# Patient Record
Sex: Male | Born: 1970 | Race: Black or African American | Hispanic: No | Marital: Married | Smoking: Former smoker
Health system: Southern US, Community
[De-identification: ages and names within clinical notes are randomized; demographics above are authoritative.]

## PROBLEM LIST (undated history)

## (undated) DIAGNOSIS — M722 Plantar fascial fibromatosis: Secondary | ICD-10-CM

## (undated) DIAGNOSIS — M549 Dorsalgia, unspecified: Secondary | ICD-10-CM

## (undated) DIAGNOSIS — R000 Tachycardia, unspecified: Secondary | ICD-10-CM

## (undated) DIAGNOSIS — K760 Fatty (change of) liver, not elsewhere classified: Secondary | ICD-10-CM

## (undated) DIAGNOSIS — G4730 Sleep apnea, unspecified: Secondary | ICD-10-CM

## (undated) DIAGNOSIS — M2140 Flat foot [pes planus] (acquired), unspecified foot: Secondary | ICD-10-CM

## (undated) DIAGNOSIS — E669 Obesity, unspecified: Secondary | ICD-10-CM

## (undated) DIAGNOSIS — K5792 Diverticulitis of intestine, part unspecified, without perforation or abscess without bleeding: Secondary | ICD-10-CM

## (undated) DIAGNOSIS — E7800 Pure hypercholesterolemia, unspecified: Secondary | ICD-10-CM

## (undated) DIAGNOSIS — G5600 Carpal tunnel syndrome, unspecified upper limb: Secondary | ICD-10-CM

## (undated) DIAGNOSIS — E785 Hyperlipidemia, unspecified: Secondary | ICD-10-CM

## (undated) DIAGNOSIS — E119 Type 2 diabetes mellitus without complications: Secondary | ICD-10-CM

## (undated) DIAGNOSIS — M6740 Ganglion, unspecified site: Secondary | ICD-10-CM

## (undated) DIAGNOSIS — I10 Essential (primary) hypertension: Secondary | ICD-10-CM

---

## 2005-02-22 ENCOUNTER — Emergency Department (HOSPITAL_COMMUNITY): Admission: EM | Admit: 2005-02-22 | Discharge: 2005-02-22 | Payer: Self-pay | Admitting: Emergency Medicine

## 2005-02-22 ENCOUNTER — Emergency Department: Payer: Self-pay | Admitting: Emergency Medicine

## 2005-02-24 ENCOUNTER — Ambulatory Visit: Payer: Self-pay

## 2008-12-03 ENCOUNTER — Emergency Department: Payer: Self-pay | Admitting: Emergency Medicine

## 2009-10-30 ENCOUNTER — Ambulatory Visit: Payer: Self-pay | Admitting: Internal Medicine

## 2009-10-30 ENCOUNTER — Inpatient Hospital Stay (HOSPITAL_COMMUNITY): Admission: EM | Admit: 2009-10-30 | Discharge: 2009-11-04 | Payer: Self-pay | Admitting: Emergency Medicine

## 2010-07-26 LAB — GLUCOSE, CAPILLARY
Glucose-Capillary: 110 mg/dL — ABNORMAL HIGH (ref 70–99)
Glucose-Capillary: 118 mg/dL — ABNORMAL HIGH (ref 70–99)
Glucose-Capillary: 118 mg/dL — ABNORMAL HIGH (ref 70–99)
Glucose-Capillary: 120 mg/dL — ABNORMAL HIGH (ref 70–99)
Glucose-Capillary: 129 mg/dL — ABNORMAL HIGH (ref 70–99)
Glucose-Capillary: 132 mg/dL — ABNORMAL HIGH (ref 70–99)
Glucose-Capillary: 135 mg/dL — ABNORMAL HIGH (ref 70–99)
Glucose-Capillary: 138 mg/dL — ABNORMAL HIGH (ref 70–99)
Glucose-Capillary: 139 mg/dL — ABNORMAL HIGH (ref 70–99)
Glucose-Capillary: 139 mg/dL — ABNORMAL HIGH (ref 70–99)
Glucose-Capillary: 139 mg/dL — ABNORMAL HIGH (ref 70–99)
Glucose-Capillary: 145 mg/dL — ABNORMAL HIGH (ref 70–99)
Glucose-Capillary: 145 mg/dL — ABNORMAL HIGH (ref 70–99)
Glucose-Capillary: 147 mg/dL — ABNORMAL HIGH (ref 70–99)
Glucose-Capillary: 149 mg/dL — ABNORMAL HIGH (ref 70–99)
Glucose-Capillary: 151 mg/dL — ABNORMAL HIGH (ref 70–99)
Glucose-Capillary: 153 mg/dL — ABNORMAL HIGH (ref 70–99)
Glucose-Capillary: 170 mg/dL — ABNORMAL HIGH (ref 70–99)
Glucose-Capillary: 174 mg/dL — ABNORMAL HIGH (ref 70–99)

## 2010-07-26 LAB — COMPREHENSIVE METABOLIC PANEL
ALT: 48 U/L (ref 0–53)
AST: 22 U/L (ref 0–37)
Albumin: 3.7 g/dL (ref 3.5–5.2)
Alkaline Phosphatase: 61 U/L (ref 39–117)
BUN: 4 mg/dL — ABNORMAL LOW (ref 6–23)
CO2: 28 mEq/L (ref 19–32)
Calcium: 9 mg/dL (ref 8.4–10.5)
Chloride: 100 mEq/L (ref 96–112)
Creatinine, Ser: 0.99 mg/dL (ref 0.4–1.5)
GFR calc Af Amer: >60 mL/min (ref >60)
GFR calc non Af Amer: >60 mL/min (ref >60)
Glucose, Bld: 133 mg/dL — ABNORMAL HIGH (ref 70–99)
Potassium: 3.9 mEq/L (ref 3.5–5.1)
Sodium: 135 mEq/L (ref 135–145)
Total Bilirubin: 1.6 mg/dL — ABNORMAL HIGH (ref 0.3–1.2)
Total Protein: 7 g/dL (ref 6.0–8.3)

## 2010-07-26 LAB — URINE MICROSCOPIC-ADD ON

## 2010-07-26 LAB — URINALYSIS, ROUTINE W REFLEX MICROSCOPIC
Glucose, UA: NEGATIVE mg/dL
Hgb urine dipstick: NEGATIVE
Ketones, ur: 15 mg/dL — AB
Nitrite: NEGATIVE
Protein, ur: 30 mg/dL — AB
Specific Gravity, Urine: 1.027 (ref 1.005–1.030)
Urobilinogen, UA: 1 mg/dL (ref 0.0–1.0)
pH: 6 (ref 5.0–8.0)

## 2010-07-26 LAB — BASIC METABOLIC PANEL
BUN: 4 mg/dL — ABNORMAL LOW (ref 6–23)
CO2: 25 mEq/L (ref 19–32)
Calcium: 8.7 mg/dL (ref 8.4–10.5)
Calcium: 8.8 mg/dL (ref 8.4–10.5)
Chloride: 98 mEq/L (ref 96–112)
Chloride: 99 mEq/L (ref 96–112)
Creatinine, Ser: 0.95 mg/dL (ref 0.4–1.5)
Creatinine, Ser: 0.95 mg/dL (ref 0.4–1.5)
GFR calc Af Amer: >60 mL/min (ref >60)
GFR calc Af Amer: >60 mL/min (ref >60)
GFR calc non Af Amer: >60 mL/min (ref >60)
Glucose, Bld: 124 mg/dL — ABNORMAL HIGH (ref 70–99)
Potassium: 3.2 mEq/L — ABNORMAL LOW (ref 3.5–5.1)
Sodium: 135 mEq/L (ref 135–145)

## 2010-07-26 LAB — CBC
HCT: 42.4 % (ref 39.0–52.0)
HCT: 43.1 % (ref 39.0–52.0)
HCT: 45.1 % (ref 39.0–52.0)
Hemoglobin: 13.1 g/dL (ref 13.0–17.0)
Hemoglobin: 14.4 g/dL (ref 13.0–17.0)
Hemoglobin: 14.7 g/dL (ref 13.0–17.0)
Hemoglobin: 15.6 g/dL (ref 13.0–17.0)
MCH: 30 pg (ref 26.0–34.0)
MCH: 30.2 pg (ref 26.0–34.0)
MCH: 30.7 pg (ref 26.0–34.0)
MCH: 30.7 pg (ref 26.0–34.0)
MCHC: 33.9 g/dL (ref 30.0–36.0)
MCHC: 34.1 g/dL (ref 30.0–36.0)
MCHC: 34.7 g/dL (ref 30.0–36.0)
MCV: 88.1 fL (ref 78.0–100.0)
MCV: 88.2 fL (ref 78.0–100.0)
MCV: 88.5 fL (ref 78.0–100.0)
MCV: 89 fL (ref 78.0–100.0)
Platelets: 224 10*3/uL (ref 150–400)
Platelets: 227 10*3/uL (ref 150–400)
Platelets: 239 10*3/uL (ref 150–400)
Platelets: 261 10*3/uL (ref 150–400)
RBC: 4.27 MIL/uL (ref 4.22–5.81)
RBC: 4.76 MIL/uL (ref 4.22–5.81)
RBC: 4.78 MIL/uL (ref 4.22–5.81)
RBC: 4.89 MIL/uL (ref 4.22–5.81)
RBC: 5.1 MIL/uL (ref 4.22–5.81)
RDW: 13 % (ref 11.5–15.5)
RDW: 13.1 % (ref 11.5–15.5)
RDW: 13.3 % (ref 11.5–15.5)
WBC: 14.1 10*3/uL — ABNORMAL HIGH (ref 4.0–10.5)
WBC: 15.5 10*3/uL — ABNORMAL HIGH (ref 4.0–10.5)
WBC: 15.8 10*3/uL — ABNORMAL HIGH (ref 4.0–10.5)
WBC: 16.6 10*3/uL — ABNORMAL HIGH (ref 4.0–10.5)

## 2010-07-26 LAB — HEMOGLOBIN A1C
Hgb A1c MFr Bld: 10.6 % — ABNORMAL HIGH (ref <5.7)
Mean Plasma Glucose: 258 mg/dL — ABNORMAL HIGH (ref <117)

## 2010-07-26 LAB — POCT I-STAT, CHEM 8
BUN: 5 mg/dL — ABNORMAL LOW (ref 6–23)
Calcium, Ion: 1.14 mmol/L (ref 1.12–1.32)
Chloride: 98 mEq/L (ref 96–112)
Creatinine, Ser: 1 mg/dL (ref 0.4–1.5)
Glucose, Bld: 156 mg/dL — ABNORMAL HIGH (ref 70–99)
HCT: 50 % (ref 39.0–52.0)
Hemoglobin: 17 g/dL (ref 13.0–17.0)
Potassium: 4 mEq/L (ref 3.5–5.1)
Sodium: 136 mEq/L (ref 135–145)
TCO2: 28 mmol/L (ref 0–100)

## 2010-07-26 LAB — DIFFERENTIAL
Basophils Absolute: 0.1 10*3/uL (ref 0.0–0.1)
Basophils Relative: 1 % (ref 0–1)
Eosinophils Absolute: 0 10*3/uL (ref 0.0–0.7)
Eosinophils Relative: 0 % (ref 0–5)
Lymphocytes Relative: 10 % — ABNORMAL LOW (ref 12–46)
Lymphs Abs: 1.4 10*3/uL (ref 0.7–4.0)
Monocytes Absolute: 1 10*3/uL (ref 0.1–1.0)
Monocytes Relative: 8 % (ref 3–12)
Neutro Abs: 11.5 10*3/uL — ABNORMAL HIGH (ref 1.7–7.7)
Neutrophils Relative %: 82 % — ABNORMAL HIGH (ref 43–77)

## 2010-07-26 LAB — LIPASE, BLOOD: Lipase: 20 U/L (ref 11–59)

## 2010-07-26 LAB — MAGNESIUM: Magnesium: 1.9 mg/dL (ref 1.5–2.5)

## 2011-01-05 ENCOUNTER — Emergency Department: Payer: Self-pay | Admitting: Emergency Medicine

## 2011-09-24 ENCOUNTER — Ambulatory Visit: Payer: Self-pay | Admitting: Internal Medicine

## 2011-09-24 LAB — CREATININE, SERUM
Creatinine: 0.82 mg/dL (ref 0.60–1.30)
EGFR (Non-African Amer.): >60

## 2012-07-04 ENCOUNTER — Emergency Department: Payer: Self-pay | Admitting: Emergency Medicine

## 2012-07-04 LAB — COMPREHENSIVE METABOLIC PANEL
Albumin: 3.6 g/dL (ref 3.4–5.0)
Alkaline Phosphatase: 90 U/L (ref 50–136)
Anion Gap: 0 — ABNORMAL LOW (ref 7–16)
Bilirubin,Total: 0.2 mg/dL (ref 0.2–1.0)
Calcium, Total: 8.8 mg/dL (ref 8.5–10.1)
Chloride: 104 mmol/L (ref 98–107)
Co2: 28 mmol/L (ref 21–32)
Creatinine: 0.96 mg/dL (ref 0.60–1.30)
Glucose: 162 mg/dL — ABNORMAL HIGH (ref 65–99)
Potassium: 3.8 mmol/L (ref 3.5–5.1)
SGOT(AST): 26 U/L (ref 15–37)
Sodium: 132 mmol/L — ABNORMAL LOW (ref 136–145)

## 2012-07-04 LAB — CBC
HCT: 42.7 % (ref 40.0–52.0)
MCHC: 33.2 g/dL (ref 32.0–36.0)
Platelet: 294 10*3/uL (ref 150–440)
RBC: 4.81 10*6/uL (ref 4.40–5.90)
RDW: 13.3 % (ref 11.5–14.5)
WBC: 11 10*3/uL — ABNORMAL HIGH (ref 3.8–10.6)

## 2012-07-04 LAB — CK TOTAL AND CKMB (NOT AT ARMC)
CK, Total: 137 U/L (ref 35–232)
CK-MB: 1.1 ng/mL (ref 0.5–3.6)

## 2012-07-04 LAB — TROPONIN I: Troponin-I: <0.02 ng/mL

## 2012-10-02 ENCOUNTER — Emergency Department: Payer: Self-pay | Admitting: Emergency Medicine

## 2012-10-02 LAB — COMPREHENSIVE METABOLIC PANEL
Alkaline Phosphatase: 96 U/L (ref 50–136)
Anion Gap: 7 (ref 7–16)
BUN: 14 mg/dL (ref 7–18)
Chloride: 97 mmol/L — ABNORMAL LOW (ref 98–107)
Creatinine: 1.08 mg/dL (ref 0.60–1.30)
EGFR (African American): >60
EGFR (Non-African Amer.): >60
Glucose: 529 mg/dL (ref 65–99)
Osmolality: 285 (ref 275–301)
SGOT(AST): 26 U/L (ref 15–37)
Total Protein: 7.7 g/dL (ref 6.4–8.2)

## 2012-10-02 LAB — CBC
Platelet: 263 10*3/uL (ref 150–440)
RDW: 12.8 % (ref 11.5–14.5)
WBC: 8.9 10*3/uL (ref 3.8–10.6)

## 2012-10-02 LAB — URINALYSIS, COMPLETE
Bilirubin,UR: NEGATIVE
Blood: NEGATIVE
Ketone: NEGATIVE
Leukocyte Esterase: NEGATIVE
Protein: NEGATIVE
RBC,UR: <1 /HPF (ref 0–5)
Squamous Epithelial: NONE SEEN
WBC UR: <1 /HPF (ref 0–5)

## 2013-02-18 ENCOUNTER — Emergency Department (HOSPITAL_COMMUNITY)
Admission: EM | Admit: 2013-02-18 | Discharge: 2013-02-18 | Disposition: A | Discharge status: Home / Self Care | Payer: 59 | Attending: Emergency Medicine | Admitting: Emergency Medicine

## 2013-02-18 ENCOUNTER — Encounter (HOSPITAL_COMMUNITY): Payer: Self-pay | Admitting: Emergency Medicine

## 2013-02-18 DIAGNOSIS — E119 Type 2 diabetes mellitus without complications: Secondary | ICD-10-CM | POA: Insufficient documentation

## 2013-02-18 DIAGNOSIS — S39012A Strain of muscle, fascia and tendon of lower back, initial encounter: Secondary | ICD-10-CM

## 2013-02-18 DIAGNOSIS — Y929 Unspecified place or not applicable: Secondary | ICD-10-CM | POA: Insufficient documentation

## 2013-02-18 DIAGNOSIS — E7800 Pure hypercholesterolemia, unspecified: Secondary | ICD-10-CM | POA: Insufficient documentation

## 2013-02-18 DIAGNOSIS — X500XXA Overexertion from strenuous movement or load, initial encounter: Secondary | ICD-10-CM | POA: Insufficient documentation

## 2013-02-18 DIAGNOSIS — Y939 Activity, unspecified: Secondary | ICD-10-CM | POA: Insufficient documentation

## 2013-02-18 DIAGNOSIS — F17200 Nicotine dependence, unspecified, uncomplicated: Secondary | ICD-10-CM | POA: Insufficient documentation

## 2013-02-18 DIAGNOSIS — Z79899 Other long term (current) drug therapy: Secondary | ICD-10-CM | POA: Insufficient documentation

## 2013-02-18 DIAGNOSIS — S339XXA Sprain of unspecified parts of lumbar spine and pelvis, initial encounter: Secondary | ICD-10-CM | POA: Insufficient documentation

## 2013-02-18 HISTORY — DX: Type 2 diabetes mellitus without complications: E11.9

## 2013-02-18 HISTORY — DX: Dorsalgia, unspecified: M54.9

## 2013-02-18 HISTORY — DX: Pure hypercholesterolemia, unspecified: E78.00

## 2013-02-18 MED ORDER — OXYCODONE-ACETAMINOPHEN 5-325 MG PO TABS
2.0000 | ORAL_TABLET | Freq: Once | ORAL | Status: AC
  Administered 2013-02-18: 2 via ORAL
  Filled 2013-02-18 (×2): qty 2

## 2013-02-18 MED ORDER — HYDROMORPHONE HCL PF 2 MG/ML IJ SOLN: 2.0000 mg | Freq: Once | INTRAMUSCULAR | Status: DC

## 2013-02-18 MED ORDER — NAPROXEN 500 MG PO TABS: 500.0000 mg | ORAL_TABLET | Freq: Two times a day (BID) | ORAL | Status: DC

## 2013-02-18 MED ORDER — CYCLOBENZAPRINE HCL 10 MG PO TABS
10.0000 mg | ORAL_TABLET | Freq: Once | ORAL | Status: AC
  Administered 2013-02-18: 10 mg via ORAL
  Filled 2013-02-18: qty 1

## 2013-02-18 MED ORDER — DIAZEPAM 5 MG/ML IJ SOLN: 10.0000 mg | Freq: Once | INTRAMUSCULAR | Status: DC

## 2013-02-18 MED ORDER — CYCLOBENZAPRINE HCL 10 MG PO TABS: 10.0000 mg | ORAL_TABLET | Freq: Two times a day (BID) | ORAL | Status: DC | PRN

## 2013-02-18 MED ORDER — OXYCODONE-ACETAMINOPHEN 5-325 MG PO TABS: 1.0000 | ORAL_TABLET | Freq: Four times a day (QID) | ORAL | Status: DC | PRN

## 2013-02-18 MED ORDER — ONDANSETRON 4 MG PO TBDP: 8.0000 mg | ORAL_TABLET | Freq: Once | ORAL | Status: DC

## 2013-02-18 MED ORDER — NAPROXEN 250 MG PO TABS
500.0000 mg | ORAL_TABLET | Freq: Once | ORAL | Status: AC
  Administered 2013-02-18: 500 mg via ORAL
  Filled 2013-02-18: qty 2

## 2013-02-18 NOTE — ED Provider Notes (Signed)
Medical screening examination/treatment/procedure(s) were performed by non-physician practitioner and as supervising physician I was immediately available for consultation/collaboration.  Vail Basista, MD 02/18/13 1958 

## 2013-02-18 NOTE — ED Provider Notes (Signed)
CSN: [id number]     Arrival date & time 02/18/13  1623 History  This chart was scribed for Vernon Bleacher, PA, working with Geoffery Lyons, MD by Blanchard Kelch, ED Scribe. This patient was seen in room TR05C/TR05C and the patient's care was started at 5:44 PM.    Chief Complaint  Patient presents with  . Back Pain    Patient is a 42 y.o. male presenting with back pain. The history is provided by the patient. No language interpreter was used.  Back Pain Associated symptoms: no fever, no numbness and no weakness     HPI Comments: Vernon Johnson is a 42 y.o. male who presents to the Emergency Department complaining of waxing and waning lower back pain that began three days ago. He was getting dressed, felt a "pop" and pain began immediately to the area. The pain does not radiate. The pain is improved with laying down. He has been taking ibuprofen for the pain without relief. He reports many prior similar episodes. He denies urinary or bowel incontinence, fever, unexplained weight loss. He denies any surgery, MRIs or injections in his back. He has a past medical history of diverticulitis and diabetes.   Past Medical History  Diagnosis Date  . Back pain   . Diabetes mellitus without complication   . High cholesterol    History reviewed. No pertinent past surgical history. History reviewed. No pertinent family history. History  Substance Use Topics  . Smoking status: Current Every Day Smoker    Types: Cigarettes  . Smokeless tobacco: Not on file  . Alcohol Use: No    Review of Systems  Constitutional: Negative for fever and unexpected weight change.  Gastrointestinal: Negative for constipation.       Negative for bowel incontinence.  Genitourinary: Negative for hematuria, flank pain and difficulty urinating.       Negative for urinary incontinence.  Musculoskeletal: Positive for back pain.  Neurological: Negative for weakness and numbness.       Negative for saddle paresthesias      Allergies  Review of patient's allergies indicates no known allergies.  Home Medications   Current Outpatient Rx  Name  Route  Sig  Dispense  Refill  . atorvastatin (LIPITOR) 40 MG tablet   Oral   Take 40 mg by mouth daily.         Marland Kitchen glipiZIDE (GLUCOTROL) 5 MG tablet   Oral   Take 5 mg by mouth daily.         . metFORMIN (GLUCOPHAGE) 1000 MG tablet   Oral   Take 1,000 mg by mouth 2 (two) times daily with a meal.          Triage Vitals: BP 133/73  Pulse 102  Temp(Src) 99.1 F (37.3 C) (Oral)  Resp 16  SpO2 99%  Physical Exam  Nursing note and vitals reviewed. Constitutional: He appears well-developed and well-nourished.  HENT:  Head: Normocephalic and atraumatic.  Eyes: Conjunctivae are normal.  Neck: Normal range of motion.  Abdominal: Soft. There is no tenderness. There is no CVA tenderness.  Musculoskeletal: Normal range of motion. He exhibits no tenderness.  No step-off noted with palpation of spine.   Neurological: He is alert. He has normal reflexes. No sensory deficit. He exhibits normal muscle tone.  5/5 strength in entire lower extremities bilaterally. No sensation deficit.   Skin: Skin is warm and dry.  Psychiatric: He has a normal mood and affect.    ED Course  Procedures (including  critical care time)  DIAGNOSTIC STUDIES: Oxygen Saturation is 99% on room air, normal by my interpretation.    COORDINATION OF CARE: 5:47 PM -Clinical suspicion of sprain with follow up with PCP if symptoms do not subside long-term. Patient verbalizes understanding and agrees with treatment plan.   Labs Review Labs Reviewed - No data to display Imaging Review No results found.  EKG Interpretation   None      7:44 PM Patient seen and examined. Medications ordered.   Vital signs reviewed and are as follows: Filed Vitals:   02/18/13 1809  BP: 97/61  Pulse: 100  Temp:   Resp: 18   No red flag s/s of low back pain. Patient was counseled on back pain  precautions and told to do activity as tolerated but do not lift, push, or pull heavy objects more than 10 pounds for the next week.  Patient counseled to use ice or heat on back for no longer than 15 minutes every hour.   Patient prescribed muscle relaxer and counseled on proper use of muscle relaxant medication.    Patient prescribed narcotic pain medicine and counseled on proper use of narcotic pain medications. Counseled not to combine this medication with others containing tylenol.   Urged patient not to drink alcohol, drive, or perform any other activities that requires focus while taking either of these medications.  Patient urged to follow-up with PCP if pain does not improve with treatment and rest or if pain becomes recurrent. Urged to return with worsening severe pain, loss of bowel or bladder control, trouble walking.   The patient verbalizes understanding and agrees with the plan.     MDM   1. Lumbosacral strain, initial encounter    Patient with back pain. No neurological deficits. Patient is ambulatory. No warning symptoms of back pain including: loss of bowel or bladder control, night sweats, waking from sleep with back pain, unexplained fevers or weight loss, h/o cancer, IVDU, recent trauma. No concern for cauda equina, epidural abscess, or other serious cause of back pain. Conservative measures such as rest, ice/heat and pain medicine indicated with PCP follow-up if no improvement with conservative management.    I personally performed the services described in this documentation, which was scribed in my presence. The recorded information has been reviewed and is accurate.    Renne Crigler, PA-C 02/18/13 1944

## 2013-02-18 NOTE — ED Notes (Signed)
Pt reports having hx of back problems, pt was getting dressed on Thursday and felt his back give out, having lower back pain.

## 2015-11-26 ENCOUNTER — Other Ambulatory Visit: Payer: Self-pay | Admitting: Family Medicine

## 2015-11-26 DIAGNOSIS — R7401 Elevation of levels of liver transaminase levels: Secondary | ICD-10-CM

## 2015-11-26 DIAGNOSIS — R740 Nonspecific elevation of levels of transaminase and lactic acid dehydrogenase [LDH]: Principal | ICD-10-CM

## 2015-12-02 ENCOUNTER — Ambulatory Visit
Admission: RE | Admit: 2015-12-02 | Discharge: 2015-12-02 | Disposition: A | Discharge status: Home / Self Care | Payer: 59 | Source: Ambulatory Visit | Attending: Family Medicine | Admitting: Family Medicine

## 2015-12-02 DIAGNOSIS — R740 Nonspecific elevation of levels of transaminase and lactic acid dehydrogenase [LDH]: Secondary | ICD-10-CM | POA: Diagnosis not present

## 2015-12-02 DIAGNOSIS — R7401 Elevation of levels of liver transaminase levels: Secondary | ICD-10-CM

## 2016-08-30 ENCOUNTER — Other Ambulatory Visit: Payer: Self-pay | Admitting: Gastroenterology

## 2016-08-30 DIAGNOSIS — R7989 Other specified abnormal findings of blood chemistry: Secondary | ICD-10-CM

## 2016-08-30 DIAGNOSIS — R935 Abnormal findings on diagnostic imaging of other abdominal regions, including retroperitoneum: Secondary | ICD-10-CM

## 2016-08-30 DIAGNOSIS — R945 Abnormal results of liver function studies: Secondary | ICD-10-CM

## 2016-09-09 ENCOUNTER — Ambulatory Visit
Admission: RE | Admit: 2016-09-09 | Discharge: 2016-09-09 | Disposition: A | Discharge status: Home / Self Care | Payer: 59 | Source: Ambulatory Visit | Attending: Gastroenterology | Admitting: Gastroenterology

## 2016-09-09 DIAGNOSIS — R935 Abnormal findings on diagnostic imaging of other abdominal regions, including retroperitoneum: Secondary | ICD-10-CM

## 2016-09-09 DIAGNOSIS — R7989 Other specified abnormal findings of blood chemistry: Secondary | ICD-10-CM

## 2016-09-09 DIAGNOSIS — R945 Abnormal results of liver function studies: Secondary | ICD-10-CM

## 2016-09-13 ENCOUNTER — Ambulatory Visit
Admission: RE | Admit: 2016-09-13 | Discharge: 2016-09-13 | Disposition: A | Discharge status: Home / Self Care | Payer: 59 | Source: Ambulatory Visit | Attending: Gastroenterology | Admitting: Gastroenterology

## 2016-09-13 DIAGNOSIS — R945 Abnormal results of liver function studies: Secondary | ICD-10-CM | POA: Insufficient documentation

## 2016-09-13 DIAGNOSIS — R935 Abnormal findings on diagnostic imaging of other abdominal regions, including retroperitoneum: Secondary | ICD-10-CM | POA: Diagnosis not present

## 2017-01-05 ENCOUNTER — Encounter: Payer: Self-pay | Admitting: *Deleted

## 2017-01-06 ENCOUNTER — Encounter
Admission: RE | Disposition: A | Discharge status: Home / Self Care | Payer: Self-pay | Source: Ambulatory Visit | Attending: Gastroenterology

## 2017-01-06 ENCOUNTER — Ambulatory Visit: Payer: 59 | Admitting: Anesthesiology

## 2017-01-06 ENCOUNTER — Encounter: Payer: Self-pay | Admitting: *Deleted

## 2017-01-06 ENCOUNTER — Ambulatory Visit
Admission: RE | Admit: 2017-01-06 | Discharge: 2017-01-06 | Disposition: A | Discharge status: Home / Self Care | Payer: 59 | Source: Ambulatory Visit | Attending: Gastroenterology | Admitting: Gastroenterology

## 2017-01-06 DIAGNOSIS — D128 Benign neoplasm of rectum: Secondary | ICD-10-CM | POA: Diagnosis not present

## 2017-01-06 DIAGNOSIS — D127 Benign neoplasm of rectosigmoid junction: Secondary | ICD-10-CM | POA: Diagnosis not present

## 2017-01-06 DIAGNOSIS — Z794 Long term (current) use of insulin: Secondary | ICD-10-CM | POA: Diagnosis not present

## 2017-01-06 DIAGNOSIS — Z87891 Personal history of nicotine dependence: Secondary | ICD-10-CM | POA: Insufficient documentation

## 2017-01-06 DIAGNOSIS — D123 Benign neoplasm of transverse colon: Secondary | ICD-10-CM | POA: Insufficient documentation

## 2017-01-06 DIAGNOSIS — E109 Type 1 diabetes mellitus without complications: Secondary | ICD-10-CM | POA: Diagnosis not present

## 2017-01-06 DIAGNOSIS — K5730 Diverticulosis of large intestine without perforation or abscess without bleeding: Secondary | ICD-10-CM | POA: Diagnosis not present

## 2017-01-06 DIAGNOSIS — G4730 Sleep apnea, unspecified: Secondary | ICD-10-CM | POA: Diagnosis not present

## 2017-01-06 DIAGNOSIS — Z6834 Body mass index (BMI) 34.0-34.9, adult: Secondary | ICD-10-CM | POA: Diagnosis not present

## 2017-01-06 DIAGNOSIS — R933 Abnormal findings on diagnostic imaging of other parts of digestive tract: Secondary | ICD-10-CM | POA: Diagnosis present

## 2017-01-06 HISTORY — DX: Ganglion, unspecified site: M67.40

## 2017-01-06 HISTORY — DX: Carpal tunnel syndrome, unspecified upper limb: G56.00

## 2017-01-06 HISTORY — PX: COLONOSCOPY WITH PROPOFOL: SHX5780

## 2017-01-06 HISTORY — DX: Hyperlipidemia, unspecified: E78.5

## 2017-01-06 HISTORY — DX: Fatty (change of) liver, not elsewhere classified: K76.0

## 2017-01-06 HISTORY — DX: Diverticulitis of intestine, part unspecified, without perforation or abscess without bleeding: K57.92

## 2017-01-06 HISTORY — DX: Tachycardia, unspecified: R00.0

## 2017-01-06 HISTORY — DX: Flat foot (pes planus) (acquired), unspecified foot: M21.40

## 2017-01-06 HISTORY — DX: Sleep apnea, unspecified: G47.30

## 2017-01-06 HISTORY — DX: Plantar fascial fibromatosis: M72.2

## 2017-01-06 HISTORY — DX: Obesity, unspecified: E66.9

## 2017-01-06 LAB — GLUCOSE, CAPILLARY: GLUCOSE-CAPILLARY: 185 mg/dL — AB (ref 65–99)

## 2017-01-06 SURGERY — COLONOSCOPY WITH PROPOFOL
Anesthesia: General

## 2017-01-06 MED ORDER — PROPOFOL 10 MG/ML IV BOLUS
INTRAVENOUS | Status: DC | PRN
  Administered 2017-01-06: 20 mg via INTRAVENOUS
  Administered 2017-01-06: 40 mg via INTRAVENOUS

## 2017-01-06 MED ORDER — PROPOFOL 500 MG/50ML IV EMUL
INTRAVENOUS | Status: AC
  Filled 2017-01-06: qty 50

## 2017-01-06 MED ORDER — LIDOCAINE HCL (PF) 2 % IJ SOLN
INTRAMUSCULAR | Status: AC
  Filled 2017-01-06: qty 2

## 2017-01-06 MED ORDER — SODIUM CHLORIDE 0.9 % IV SOLN
INTRAVENOUS | Status: DC
  Administered 2017-01-06: 08:00:00 via INTRAVENOUS

## 2017-01-06 MED ORDER — PROPOFOL 500 MG/50ML IV EMUL
INTRAVENOUS | Status: DC | PRN
  Administered 2017-01-06: 120 ug/kg/min via INTRAVENOUS

## 2017-01-06 NOTE — Op Note (Signed)
[facility] Patient Name: Vernon Johnson Procedure Date: 01/06/2017 8:14 AM MRN: [id number] Account #: [id number] Date of Birth: 29-Jan-1971 Admit Type: Outpatient Age: 46 Room: [facility] ENDO ROOM 1 Gender: Male Note Status: Finalized Procedure:            Colonoscopy Indications:          Abnormal CT of the GI tract Providers:            Lollie Sails, MD Referring MD:         Rubbie Battiest. Iona Beard MD, MD (Referring MD) Medicines:            Monitored Anesthesia Care Complications:        No immediate complications. Procedure:            Pre-Anesthesia Assessment:                       - ASA Grade Assessment: III - A patient with severe                        systemic disease.                       After obtaining informed consent, the colonoscope was                        passed under direct vision. Throughout the procedure,                        the patient's blood pressure, pulse, and oxygen                        saturations were monitored continuously. The Olympus                        PCF-H180AL colonoscope ( S#: [id number] ) was introduced                        through the anus and advanced to the the cecum,                        identified by appendiceal orifice and ileocecal valve.                        The colonoscopy was performed without difficulty. The                        patient tolerated the procedure well. The quality of                        the bowel preparation was good. Findings:      Multiple small-mouthed diverticula were found in the sigmoid colon and       distal descending colon. Two areas of mild peridiverticular inflammation.      A 4 mm polyp was found in the recto-sigmoid colon. The polyp was       sessile. The polyp was removed with a cold snare. Resection and       retrieval were complete.      A 12 mm polyp was found in the hepatic flexure. The polyp was sessile.       Polypectomy was attempted,  initially  using a cold snare. Polyp resection       was incomplete with this device. This intervention then required a       different device and polypectomy technique. The polyp was removed with a       cold biopsy forceps. Resection and retrieval were complete. To prevent       bleeding after the polypectomy, one hemostatic clip was successfully       placed (MR conditional). There was no bleeding at the end of the       maneuver.      A 3 mm polyp was found in the rectum. The polyp was sessile. The polyp       was removed with a cold biopsy forceps. Resection and retrieval were       complete.      The digital rectal exam was normal.      The retroflexed view of the distal rectum and anal verge was normal and       showed no anal or rectal abnormalities. Impression:           - Diverticulosis in the sigmoid colon and in the distal                        descending colon.                       - One 4 mm polyp at the recto-sigmoid colon, removed                        with a cold snare. Resected and retrieved.                       - One 12 mm polyp at the hepatic flexure, removed with                        a cold biopsy forceps. Resected and retrieved. Clip (MR                        conditional) was placed.                       - One 3 mm polyp in the rectum, removed with a cold                        biopsy forceps. Resected and retrieved. Recommendation:       - Discharge patient to home.                       - Telephone GI clinic for pathology results in 1 week.                       - Await pathology results.                       - Low residue diet for 2 days, then advance as                        tolerated to advance diet as tolerated.                       - Use Citrucel one  tablespoon PO daily. Procedure Code(s):    --- Professional ---                       [phone], Colonoscopy, flexible; with removal of tumor(s),                        polyp(s), or other lesion(s) by snare  technique                       [id number], 85, Colonoscopy, flexible; with biopsy, single                        or multiple Diagnosis Code(s):    --- Professional ---                       D12.7, Benign neoplasm of rectosigmoid junction                       D12.3, Benign neoplasm of transverse colon (hepatic                        flexure or splenic flexure)                       K62.1, Rectal polyp                       K57.30, Diverticulosis of large intestine without                        perforation or abscess without bleeding                       R93.3, Abnormal findings on diagnostic imaging of other                        parts of digestive tract CPT copyright 2016 American Medical Association. All rights reserved. The codes documented in this report are preliminary and upon coder review may  be revised to meet current compliance requirements. Lollie Sails, MD 01/06/2017 9:14:54 AM This report has been signed electronically. Number of Addenda: 0 Note Initiated On: 01/06/2017 8:14 AM Scope Withdrawal Time: 0 hours 9 minutes 44 seconds  Total Procedure Duration: 0 hours 34 minutes 41 seconds       [facility]

## 2017-01-06 NOTE — Anesthesia Post-op Follow-up Note (Signed)
Anesthesia QCDR form completed.        

## 2017-01-06 NOTE — Anesthesia Preprocedure Evaluation (Signed)
Anesthesia Evaluation  Patient identified by MRN, date of birth, ID band Patient awake    Reviewed: Allergy & Precautions, NPO status , Patient's Chart, lab work & pertinent test results  Airway Mallampati: II       Dental  (+) Teeth Intact   Pulmonary sleep apnea , former smoker,     + decreased breath sounds      Cardiovascular Exercise Tolerance: Good  Rhythm:Regular     Neuro/Psych negative psych ROS   GI/Hepatic negative GI ROS, Neg liver ROS,   Endo/Other  diabetes, Type 1, Insulin DependentMorbid obesity  Renal/GU      Musculoskeletal negative musculoskeletal ROS (+)   Abdominal (+) + obese,   Peds negative pediatric ROS (+)  Hematology   Anesthesia Other Findings   Reproductive/Obstetrics                             Anesthesia Physical Anesthesia Plan  ASA: III  Anesthesia Plan: General   Post-op Pain Management:    Induction: Intravenous  PONV Risk Score and Plan:   Airway Management Planned: Natural Airway and Nasal Cannula  Additional Equipment:   Intra-op Plan:   Post-operative Plan:   Informed Consent: I have reviewed the patients History and Physical, chart, labs and discussed the procedure including the risks, benefits and alternatives for the proposed anesthesia with the patient or authorized representative who has indicated his/her understanding and acceptance.     Plan Discussed with: Surgeon  Anesthesia Plan Comments:         Anesthesia Quick Evaluation

## 2017-01-06 NOTE — Anesthesia Postprocedure Evaluation (Signed)
Anesthesia Post Note  Patient: Vernon Johnson  Procedure(s) Performed: Procedure(s) (LRB): COLONOSCOPY WITH PROPOFOL (N/A)  Patient location during evaluation: PACU Anesthesia Type: General Level of consciousness: awake Pain management: pain level controlled Vital Signs Assessment: post-procedure vital signs reviewed and stable Respiratory status: spontaneous breathing Cardiovascular status: stable Anesthetic complications: no     Last Vitals:  Vitals:   01/06/17 0930 01/06/17 0950  BP: 137/89 (!) 135/93  Pulse: 83 77  Resp: 20   Temp:    SpO2: 100% 100%    Last Pain:  Vitals:   01/06/17 0912  TempSrc: Tympanic                 VAN STAVEREN,Alsie Younes

## 2017-01-06 NOTE — Transfer of Care (Signed)
Immediate Anesthesia Transfer of Care Note  Patient: Vernon Johnson  Procedure(s) Performed: Procedure(s): COLONOSCOPY WITH PROPOFOL (N/A)  Patient Location: PACU  Anesthesia Type:General  Level of Consciousness: awake  Airway & Oxygen Therapy: Patient Spontanous Breathing and Patient connected to nasal cannula oxygen  Post-op Assessment: Report given to RN and Post -op Vital signs reviewed and stable  Post vital signs: Reviewed  Last Vitals:  Vitals:   01/06/17 0802  BP: 127/76  Pulse: 91  Resp: 18  Temp: (!) 36.3 C  SpO2: 99%    Last Pain:  Vitals:   01/06/17 0802  TempSrc: Tympanic         Complications: No apparent anesthesia complications

## 2017-01-07 ENCOUNTER — Encounter: Payer: Self-pay | Admitting: Gastroenterology

## 2017-01-08 LAB — SURGICAL PATHOLOGY

## 2017-09-12 ENCOUNTER — Emergency Department
Admission: EM | Admit: 2017-09-12 | Discharge: 2017-09-12 | Disposition: A | Discharge status: Home / Self Care | Payer: 59 | Attending: Emergency Medicine | Admitting: Emergency Medicine

## 2017-09-12 ENCOUNTER — Other Ambulatory Visit: Payer: Self-pay

## 2017-09-12 ENCOUNTER — Emergency Department: Payer: 59

## 2017-09-12 DIAGNOSIS — Z87891 Personal history of nicotine dependence: Secondary | ICD-10-CM | POA: Insufficient documentation

## 2017-09-12 DIAGNOSIS — Z7982 Long term (current) use of aspirin: Secondary | ICD-10-CM | POA: Insufficient documentation

## 2017-09-12 DIAGNOSIS — E119 Type 2 diabetes mellitus without complications: Secondary | ICD-10-CM | POA: Diagnosis not present

## 2017-09-12 DIAGNOSIS — R079 Chest pain, unspecified: Secondary | ICD-10-CM | POA: Insufficient documentation

## 2017-09-12 LAB — CBC
HCT: 44.1 % (ref 40.0–52.0)
HEMOGLOBIN: 15.4 g/dL (ref 13.0–18.0)
MCH: 30.2 pg (ref 26.0–34.0)
MCHC: 35 g/dL (ref 32.0–36.0)
MCV: 86.5 fL (ref 80.0–100.0)
PLATELETS: 294 10*3/uL (ref 150–440)
RBC: 5.1 MIL/uL (ref 4.40–5.90)
RDW: 13.5 % (ref 11.5–14.5)
WBC: 9.1 10*3/uL (ref 3.8–10.6)

## 2017-09-12 LAB — BASIC METABOLIC PANEL
ANION GAP: 10 (ref 5–15)
BUN: 13 mg/dL (ref 6–20)
CHLORIDE: 100 mmol/L — AB (ref 101–111)
CO2: 24 mmol/L (ref 22–32)
CREATININE: 0.84 mg/dL (ref 0.61–1.24)
Calcium: 9.2 mg/dL (ref 8.9–10.3)
GFR calc non Af Amer: >60 mL/min (ref >60)
Glucose, Bld: 265 mg/dL — ABNORMAL HIGH (ref 65–99)
POTASSIUM: 3.8 mmol/L (ref 3.5–5.1)
SODIUM: 134 mmol/L — AB (ref 135–145)

## 2017-09-12 LAB — TROPONIN I
Troponin I: <0.03 ng/mL (ref <0.03)
Troponin I: <0.03 ng/mL (ref <0.03)

## 2017-09-12 MED ORDER — GI COCKTAIL ~~LOC~~
30.0000 mL | Freq: Once | ORAL | Status: AC
  Administered 2017-09-12: 30 mL via ORAL
  Filled 2017-09-12: qty 30

## 2017-09-12 MED ORDER — PANTOPRAZOLE SODIUM 40 MG PO TBEC: 40.0000 mg | DELAYED_RELEASE_TABLET | Freq: Every day | ORAL | 1 refills | Status: DC

## 2017-09-12 NOTE — ED Provider Notes (Signed)
[facility] Emergency Department Provider Note  Time seen: 8:07 AM  I have reviewed the triage vital signs and the nursing notes.   HISTORY  Chief Complaint Chest Pain    HPI Vernon Johnson is a 47 y.o. male with a past medical history of diabetes, hypertension, hyperlipidemia, presents to the emergency department for chest discomfort.  According to the patient for the past 2 to 3 weeks he has been experiencing intermittent pain in his left chest.  States the pain lasts 3 to 5 minutes and then will resolve but happens nearly every day.  This morning around 2:00 the same pain occurred but with some nausea as well so the patient came to the emergency department.  Denies any shortness of breath at any time.  Denies any diaphoresis.  No leg pain or swelling.  No history of heart disease, no family history of heart disease.  Patient states mild discomfort in the left chest currently.  States it is not "painful" he is just aware of something in the left chest.  Patient states he notices the discomfort when he lays down at night.  Denies any worsening with deep inspiration.  Denies any cough or congestion.  Denies any worsening during the day with exertion.   Past Medical History:  Diagnosis Date  . Acquired pes planus   . Back pain   . Carpal tunnel syndrome   . Diabetes mellitus without complication ([place])   . Diverticulitis   . Fatty liver   . Ganglion cyst   . High cholesterol   . HLD (hyperlipidemia)   . Obesity   . Plantar fasciitis   . Sleep apnea   . Tachycardia     There are no active problems to display for this patient.   Past Surgical History:  Procedure Laterality Date  . COLONOSCOPY WITH PROPOFOL N/A 01/06/2017   Procedure: COLONOSCOPY WITH PROPOFOL;  Surgeon: Lollie Sails, MD;  Location: [facility] ENDOSCOPY;  Service: Endoscopy;  Laterality: N/A;    Prior to Admission medications   Medication Sig Start Date End Date Taking? Authorizing  Provider  aspirin 81 MG chewable tablet Chew 81 mg by mouth daily.    [provider]  glipiZIDE (GLUCOTROL) 5 MG tablet Take 5 mg by mouth daily.    [provider]  glucose blood test strip 1 each by Other route as needed for other. Use as instructed    [provider]  insulin glargine (LANTUS) 100 UNIT/ML injection Inject into the skin at bedtime.    [provider]  sitaGLIPtin-metformin (JANUMET) 50-1000 MG tablet Take 1 tablet by mouth 2 (two) times daily with a meal.    [provider]    No Known Allergies  No family history on file.  Social History Social History   Tobacco Use  . Smoking status: Former Smoker    Types: Cigarettes    Last attempt to quit: 05/11/2015    Years since quitting: 2.3  . Smokeless tobacco: Current User    Types: Chew  Substance Use Topics  . Alcohol use: No  . Drug use: No    Review of Systems Constitutional: Negative for fever. Eyes: Negative for visual complaints ENT: Negative for recent illness/congestion Cardiovascular: Intermittent left chest pain Respiratory: Negative for shortness of breath.  Gastrointestinal: Negative for abdominal pain, vomiting.  Some nausea this morning, resolved. Genitourinary: Negative for urinary compaints Musculoskeletal: Negative for leg pain or swelling Skin: Negative for skin complaints  Neurological: Negative  for headache All other ROS negative  ____________________________________________   PHYSICAL EXAM:  VITAL SIGNS: ED Triage Vitals  Enc Vitals Group     BP 09/12/17 0315 138/90     Pulse Rate 09/12/17 0315 (!) 106     Resp 09/12/17 0315 20     Temp 09/12/17 0315 98.4 F (36.9 C)     Temp Source 09/12/17 0315 Oral     SpO2 09/12/17 0315 98 %     Weight 09/12/17 0315 246 lb (111.6 kg)     Height 09/12/17 0315 5\' 10"  (1.778 m)     Head Circumference --      Peak Flow --      Pain Score 09/12/17 0319 2     Pain Loc --      Pain Edu? --       Excl. in [place]? --     Constitutional: Alert and oriented. Well appearing and in no distress. Eyes: Normal exam ENT   Head: Normocephalic and atraumatic.   Mouth/Throat: Mucous membranes are moist. Cardiovascular: Normal rate, regular rhythm. No murmurs, rubs, or gallops. Respiratory: Normal respiratory effort without tachypnea nor retractions. Breath sounds are clear and equal bilaterally. No wheezes/rales/rhonchi. Gastrointestinal: Soft and nontender. No distention.  Musculoskeletal: Nontender with normal range of motion in all extremities. No lower extremity tenderness or edema. Neurologic:  Normal speech and language. No gross focal neurologic deficits Skin:  Skin is warm, dry and intact.  Psychiatric: Mood and affect are normal.   ____________________________________________    EKG  EKG reviewed and interpreted by myself shows sinus tachycardia at 107 bpm with a narrow QRS, normal axis, normal intervals, no concerning ST changes.  ____________________________________________    RADIOLOGY  Chest x-ray negative  ____________________________________________   INITIAL IMPRESSION / ASSESSMENT AND PLAN / ED COURSE  Pertinent labs & imaging results that were available during my care of the patient were reviewed by me and considered in my medical decision making (see chart for details).  Patient presents to the emergency department for intermittent left chest pain.  Differential would include ACS, reflux, musculoskeletal pain, chest wall pain, pneumonia, pneumothorax.  Overall the patient appears very well, no distress.  Labs including cardiac enzymes were negative.  Chest x-ray is normal.  EKG is reassuring.  We will repeat a troponin and dose a GI cocktail and monitor for symptom relief.  Patient denies any pain at this point but states he just aware of something in his left chest.  Nontender to palpation.  No cough.  Clear/normal lung sounds and heart sounds on my  examination.  Patient states near complete resolution of discomfort after GI cocktail.  Second troponin is negative.  I discussed cardiology follow-up for stress test as well as a trial of Protonix and taking liquid Maalox at night.  Patient agreeable to this plan of care.  Discussed my normal chest pain return precautions.  ____________________________________________   FINAL CLINICAL IMPRESSION(S) / ED DIAGNOSES  Chest pain    Harvest Dark, MD 09/12/17 [phone]

## 2017-09-12 NOTE — Discharge Instructions (Signed)
You have been seen in the emergency department today for chest pain. Your workup has shown normal results. As we discussed please follow-up with your primary care physician in the next 1-2 days for recheck. Return to the emergency department for any further chest pain, trouble breathing, or any other symptom personally concerning to yourself. °

## 2017-09-12 NOTE — ED Notes (Signed)
Pt discharged home after verbalizing understanding of discharge instructions; nad noted. 

## 2017-09-12 NOTE — ED Triage Notes (Signed)
Pt states central chest pain for over 2 weeks. Pt states tonight he has been nauseated. Pt denies shob, pain radiation, diaphoresis, or dizziness.

## 2017-09-12 NOTE — ED Notes (Signed)
Pt presents with chest pain x 2 weeks. States it feels sharp, intermittently. Pt states it has been worse in last few hours. Pt alert & oriented with NAD noted.

## 2018-01-03 ENCOUNTER — Encounter (HOSPITAL_COMMUNITY): Payer: Self-pay | Admitting: Emergency Medicine

## 2018-01-03 ENCOUNTER — Emergency Department (HOSPITAL_COMMUNITY)
Admission: EM | Admit: 2018-01-03 | Discharge: 2018-01-04 | Disposition: A | Discharge status: Home / Self Care | Payer: 59 | Attending: Emergency Medicine | Admitting: Emergency Medicine

## 2018-01-03 ENCOUNTER — Other Ambulatory Visit: Payer: Self-pay

## 2018-01-03 DIAGNOSIS — K5732 Diverticulitis of large intestine without perforation or abscess without bleeding: Secondary | ICD-10-CM | POA: Diagnosis not present

## 2018-01-03 DIAGNOSIS — K5792 Diverticulitis of intestine, part unspecified, without perforation or abscess without bleeding: Secondary | ICD-10-CM

## 2018-01-03 DIAGNOSIS — Z79899 Other long term (current) drug therapy: Secondary | ICD-10-CM | POA: Insufficient documentation

## 2018-01-03 DIAGNOSIS — Z794 Long term (current) use of insulin: Secondary | ICD-10-CM | POA: Insufficient documentation

## 2018-01-03 DIAGNOSIS — E119 Type 2 diabetes mellitus without complications: Secondary | ICD-10-CM | POA: Insufficient documentation

## 2018-01-03 DIAGNOSIS — Z7982 Long term (current) use of aspirin: Secondary | ICD-10-CM | POA: Insufficient documentation

## 2018-01-03 DIAGNOSIS — Z87891 Personal history of nicotine dependence: Secondary | ICD-10-CM | POA: Insufficient documentation

## 2018-01-03 DIAGNOSIS — R1032 Left lower quadrant pain: Secondary | ICD-10-CM | POA: Diagnosis present

## 2018-01-03 NOTE — ED Triage Notes (Signed)
Pt states he is having a flare up of diverticulitis, has been diagnosed previously. Pt c/o abdominal pain and pain while having bowel movements. Pain located in lower mid anterior abdomen. Denies fever

## 2018-01-04 LAB — URINALYSIS, ROUTINE W REFLEX MICROSCOPIC
BACTERIA UA: NONE SEEN
BILIRUBIN URINE: NEGATIVE
HGB URINE DIPSTICK: NEGATIVE
KETONES UR: NEGATIVE mg/dL
LEUKOCYTES UA: NEGATIVE
NITRITE: NEGATIVE
PH: 6 (ref 5.0–8.0)
Protein, ur: NEGATIVE mg/dL
Specific Gravity, Urine: 1.027 (ref 1.005–1.030)

## 2018-01-04 LAB — CBC
HEMATOCRIT: 47.2 % (ref 39.0–52.0)
Hemoglobin: 15.6 g/dL (ref 13.0–17.0)
MCH: 28.9 pg (ref 26.0–34.0)
MCHC: 33.1 g/dL (ref 30.0–36.0)
MCV: 87.4 fL (ref 78.0–100.0)
PLATELETS: 263 10*3/uL (ref 150–400)
RBC: 5.4 MIL/uL (ref 4.22–5.81)
RDW: 12.3 % (ref 11.5–15.5)
WBC: 11.4 10*3/uL — AB (ref 4.0–10.5)

## 2018-01-04 LAB — COMPREHENSIVE METABOLIC PANEL
ALT: 90 U/L — ABNORMAL HIGH (ref 0–44)
AST: 46 U/L — AB (ref 15–41)
Albumin: 4.1 g/dL (ref 3.5–5.0)
Alkaline Phosphatase: 78 U/L (ref 38–126)
Anion gap: 14 (ref 5–15)
BILIRUBIN TOTAL: 1 mg/dL (ref 0.3–1.2)
BUN: 10 mg/dL (ref 6–20)
CO2: 23 mmol/L (ref 22–32)
Calcium: 9.6 mg/dL (ref 8.9–10.3)
Chloride: 98 mmol/L (ref 98–111)
Creatinine, Ser: 0.99 mg/dL (ref 0.61–1.24)
GFR calc Af Amer: >60 mL/min (ref >60)
Glucose, Bld: 328 mg/dL — ABNORMAL HIGH (ref 70–99)
POTASSIUM: 4 mmol/L (ref 3.5–5.1)
Sodium: 135 mmol/L (ref 135–145)
TOTAL PROTEIN: 7.7 g/dL (ref 6.5–8.1)

## 2018-01-04 LAB — LIPASE, BLOOD: Lipase: 36 U/L (ref 11–51)

## 2018-01-04 MED ORDER — HYDROCODONE-ACETAMINOPHEN 5-325 MG PO TABS: 1.0000 | ORAL_TABLET | Freq: Four times a day (QID) | ORAL | 0 refills | Status: AC | PRN

## 2018-01-04 MED ORDER — CIPROFLOXACIN HCL 500 MG PO TABS
500.0000 mg | ORAL_TABLET | Freq: Once | ORAL | Status: AC
  Administered 2018-01-04: 500 mg via ORAL
  Filled 2018-01-04: qty 1

## 2018-01-04 MED ORDER — METRONIDAZOLE 500 MG PO TABS: 500.0000 mg | ORAL_TABLET | Freq: Three times a day (TID) | ORAL | 0 refills | Status: DC

## 2018-01-04 MED ORDER — METRONIDAZOLE 500 MG PO TABS
500.0000 mg | ORAL_TABLET | Freq: Once | ORAL | Status: AC
Start: 2018-01-04 — End: 2018-01-04
  Administered 2018-01-04: 500 mg via ORAL
  Filled 2018-01-04: qty 1

## 2018-01-04 MED ORDER — CIPROFLOXACIN HCL 500 MG PO TABS: 500.0000 mg | ORAL_TABLET | Freq: Two times a day (BID) | ORAL | 0 refills | Status: DC

## 2018-01-04 NOTE — ED Provider Notes (Signed)
[place] EMERGENCY DEPARTMENT Provider Note   CSN: [id number] Arrival date & time: 01/03/18  2345     History   Chief Complaint Chief Complaint  Patient presents with  . Abdominal Pain    HPI Vernon Johnson is a 47 y.o. male.  Patient is a 47 year old male with past medical history of diverticulitis and diabetes.  He presents with complaints of abdominal pain.  This started 2 days ago and is worsening.  His pain is located to the left lower quadrant.  There is no associated fever, nausea, hematochezia, dysuria, or other complaints.  This feels identical to prior flareups of diverticulitis he is experienced in the past.  The history is provided by the patient.  Abdominal Pain   This is a recurrent problem. The current episode started 2 days ago. The problem occurs constantly. The problem has been gradually worsening. The pain is associated with an unknown factor. The pain is located in the LLQ. The quality of the pain is cramping. The pain is moderate. Pertinent negatives include fever, hematochezia, melena, nausea, vomiting and constipation. The symptoms are aggravated by certain positions, bowel movements and palpation. Nothing relieves the symptoms.    Past Medical History:  Diagnosis Date  . Acquired pes planus   . Back pain   . Carpal tunnel syndrome   . Diabetes mellitus without complication ([place])   . Diverticulitis   . Fatty liver   . Ganglion cyst   . High cholesterol   . HLD (hyperlipidemia)   . Obesity   . Plantar fasciitis   . Sleep apnea   . Tachycardia     There are no active problems to display for this patient.   Past Surgical History:  Procedure Laterality Date  . COLONOSCOPY WITH PROPOFOL N/A 01/06/2017   Procedure: COLONOSCOPY WITH PROPOFOL;  Surgeon: Lollie Sails, MD;  Location: [facility] ENDOSCOPY;  Service: Endoscopy;  Laterality: N/A;        Home Medications    Prior to Admission medications   Medication Sig  Start Date End Date Taking? Authorizing Provider  aspirin 81 MG chewable tablet Chew 81 mg by mouth daily.    [provider]  glipiZIDE (GLUCOTROL) 10 MG tablet Take 20 mg by mouth 2 (two) times daily before a meal.     [provider]  glucose blood test strip 1 each by Other route as needed for other. Use as instructed    [provider]  insulin glargine (LANTUS) 100 UNIT/ML injection Inject 50 Units into the skin at bedtime.     [provider]  Melatonin Gummies 2.5 MG CHEW Chew 1 tablet by mouth at bedtime as needed (sleep).    [provider]  pantoprazole (PROTONIX) 40 MG tablet Take 1 tablet (40 mg total) by mouth daily. 09/12/17 09/12/18  Harvest Dark, MD  sitaGLIPtin-metformin (JANUMET) 50-1000 MG tablet Take 1 tablet by mouth 2 (two) times daily with a meal.    [provider]    Family History No family history on file.  Social History Social History   Tobacco Use  . Smoking status: Former Smoker    Types: Cigarettes    Last attempt to quit: 05/11/2015    Years since quitting: 2.6  . Smokeless tobacco: Current User    Types: Chew  Substance Use Topics  . Alcohol use: No  . Drug use: No     Allergies   Patient has no known allergies.   Review of  Systems Review of Systems  Constitutional: Negative for fever.  Gastrointestinal: Positive for abdominal pain. Negative for constipation, hematochezia, melena, nausea and vomiting.  All other systems reviewed and are negative.    Physical Exam Updated Vital Signs BP 132/86 (BP Location: Right Arm)   Pulse 99   Resp 18   Ht 5\' 10"  (1.778 m)   Wt 111.1 kg   SpO2 98%   BMI 35.15 kg/m   Physical Exam  Constitutional: He is oriented to person, place, and time. He appears well-developed and well-nourished. No distress.  HENT:  Head: Normocephalic and atraumatic.  Mouth/Throat: Oropharynx is clear and moist.  Neck: Normal range of motion. Neck supple.    Cardiovascular: Normal rate and regular rhythm. Exam reveals no friction rub.  No murmur heard. Pulmonary/Chest: Effort normal and breath sounds normal. No respiratory distress. He has no wheezes. He has no rales.  Abdominal: Soft. Bowel sounds are normal. He exhibits no distension. There is tenderness in the left lower quadrant. There is no rigidity, no rebound and no guarding.  There is tenderness to palpation in the left lower quadrant.  Musculoskeletal: Normal range of motion. He exhibits no edema.  Neurological: He is alert and oriented to person, place, and time. Coordination normal.  Skin: Skin is warm and dry. He is not diaphoretic.  Nursing note and vitals reviewed.    ED Treatments / Results  Labs (all labs ordered are listed, but only abnormal results are displayed) Labs Reviewed  COMPREHENSIVE METABOLIC PANEL - Abnormal; Notable for the following components:      Result Value   Glucose, Bld 328 (*)    AST 46 (*)    ALT 90 (*)    All other components within normal limits  CBC - Abnormal; Notable for the following components:   WBC 11.4 (*)    All other components within normal limits  URINALYSIS, ROUTINE W REFLEX MICROSCOPIC - Abnormal; Notable for the following components:   Glucose, UA >=500 (*)    All other components within normal limits  LIPASE, BLOOD    EKG None  Radiology No results found.  Procedures Procedures (including critical care time)  Medications Ordered in ED Medications - No data to display   Initial Impression / Assessment and Plan / ED Course  I have reviewed the triage vital signs and the nursing notes.  Pertinent labs & imaging results that were available during my care of the patient were reviewed by me and considered in my medical decision making (see chart for details).  Patient presenting with left lower quadrant pain for the past 2 days.  There is tenderness in the left lower quadrant with no peritoneal signs.  He has a white  count of 11,000 and is nontoxic-appearing.  Patient tells me that this is identical to what he experienced with prior diverticulitis.  I think it would be appropriate to prescribe antibiotics and pain medication, and discharge.  The patient is comfortable with this and tells me this is exactly what he experienced in the past.  He understands to return if symptoms worsen or change.  Final Clinical Impressions(s) / ED Diagnoses   Final diagnoses:  None    ED Discharge Orders    None       Veryl Speak, MD 01/04/18 [phone]

## 2018-01-04 NOTE — Discharge Instructions (Signed)
Cipro and Flagyl as prescribed.  Hydrocodone is prescribed as needed for pain.  All up with your primary doctor if symptoms are not improving in the next 2 days, and return to the ER if you develop worsening pain, high fever, bloody stools, or other new and concerning symptoms.

## 2019-02-25 ENCOUNTER — Other Ambulatory Visit: Payer: Self-pay

## 2019-02-25 ENCOUNTER — Ambulatory Visit
Admission: EM | Admit: 2019-02-25 | Discharge: 2019-02-25 | Disposition: A | Discharge status: Home / Self Care | Payer: 59 | Attending: Family Medicine | Admitting: Family Medicine

## 2019-02-25 ENCOUNTER — Encounter: Payer: Self-pay | Admitting: Emergency Medicine

## 2019-02-25 ENCOUNTER — Ambulatory Visit (INDEPENDENT_AMBULATORY_CARE_PROVIDER_SITE_OTHER): Payer: 59

## 2019-02-25 DIAGNOSIS — M79604 Pain in right leg: Secondary | ICD-10-CM

## 2019-02-25 DIAGNOSIS — Z87891 Personal history of nicotine dependence: Secondary | ICD-10-CM

## 2019-02-25 DIAGNOSIS — S80821A Blister (nonthermal), right lower leg, initial encounter: Secondary | ICD-10-CM

## 2019-02-25 DIAGNOSIS — E119 Type 2 diabetes mellitus without complications: Secondary | ICD-10-CM

## 2019-02-25 MED ORDER — MUPIROCIN 2 % EX OINT: TOPICAL_OINTMENT | CUTANEOUS | 0 refills | Status: AC

## 2019-02-25 MED ORDER — CEPHALEXIN 500 MG PO CAPS: 500.0000 mg | ORAL_CAPSULE | Freq: Three times a day (TID) | ORAL | 0 refills | Status: AC
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
## 2019-02-25 NOTE — Discharge Instructions (Signed)
Take medication as prescribed. Rest. Drink plenty of fluids. Elevate. Keep clean. Monitor.   Follow up with your primary care physician this week as needed. Return to Urgent care for new or worsening concerns.

## 2019-03-08 ENCOUNTER — Other Ambulatory Visit: Payer: Self-pay

## 2019-03-08 ENCOUNTER — Encounter: Payer: 59 | Attending: Internal Medicine | Admitting: Internal Medicine

## 2019-03-08 DIAGNOSIS — Z87891 Personal history of nicotine dependence: Secondary | ICD-10-CM | POA: Diagnosis not present

## 2019-03-08 DIAGNOSIS — G4733 Obstructive sleep apnea (adult) (pediatric): Secondary | ICD-10-CM | POA: Insufficient documentation

## 2019-03-08 DIAGNOSIS — K5790 Diverticulosis of intestine, part unspecified, without perforation or abscess without bleeding: Secondary | ICD-10-CM | POA: Insufficient documentation

## 2019-03-08 DIAGNOSIS — E11622 Type 2 diabetes mellitus with other skin ulcer: Secondary | ICD-10-CM | POA: Diagnosis present

## 2019-03-08 DIAGNOSIS — K760 Fatty (change of) liver, not elsewhere classified: Secondary | ICD-10-CM | POA: Diagnosis not present

## 2019-03-08 DIAGNOSIS — E785 Hyperlipidemia, unspecified: Secondary | ICD-10-CM | POA: Diagnosis not present

## 2019-03-08 DIAGNOSIS — L97811 Non-pressure chronic ulcer of other part of right lower leg limited to breakdown of skin: Secondary | ICD-10-CM | POA: Insufficient documentation

## 2019-03-08 NOTE — Progress Notes (Signed)
[name], [name] ([id number]) Visit Report for 03/08/2019 Abuse/Suicide Risk Screen Details Patient Name: Vernon Johnson, Vernon Johnson. Date of Service: 03/08/2019 2:45 PM Medical Record Number: [id number] Patient Account Number: [id number] Date of Birth/Sex: [birth year] (48 y.o. M) Treating RN: Harold Barban Primary Care Marvetta Vohs: Salome Holmes Other Clinician: Referring Kathleen Likins: Johny Drilling Treating Seymour Pavlak/Extender: Tito Dine in Treatment: 0 Abuse/Suicide Risk Screen Items Answer ABUSE RISK SCREEN: Has anyone close to you tried to hurt or harm you recentlyo No Do you feel uncomfortable with anyone in your familyo No Has anyone forced you do things that you didnot want to doo No Electronic Signature(s) Signed: 03/08/2019 5:01:15 PM By: Harold Barban Entered By: Harold Barban on 03/08/2019 15:17:40 Vernon Johnson ([id number]) -------------------------------------------------------------------------------- Activities of Daily Living Details Patient Name: Vernon Johnson. Date of Service: 03/08/2019 2:45 PM Medical Record Number: [id number] Patient Account Number: [id number] Date of Birth/Sex: Feb 14, 1971 (48 y.o. M) Treating RN: Harold Barban Primary Care Felicia Both: Salome Holmes Other Clinician: Referring Terrilee Dudzik: Johny Drilling Treating Waymon Laser/Extender: Tito Dine in Treatment: 0 Activities of Daily Living Items Answer Activities of Daily Living (Please select one for each item) Drive Automobile Completely Able Take Medications Completely Able Use Telephone Completely Able Care for Appearance Completely Able Use Toilet Completely Able Bath / Shower Completely Able Dress Self Completely Able Feed Self Completely Able Walk Completely Able Get In / Out Bed Completely Able Housework Completely Able Prepare Meals Completely Able Handle Money Completely Able Shop for Self Completely Able Electronic Signature(s) Signed:  03/08/2019 5:01:15 PM By: Harold Barban Entered By: Harold Barban on 03/08/2019 15:18:28 Vernon Johnson ([id number]) -------------------------------------------------------------------------------- Education Screening Details Patient Name: Vernon Johnson Date of Service: 03/08/2019 2:45 PM Medical Record Number: [id number] Patient Account Number: [id number] Date of Birth/Sex: 01/29/1971 (48 y.o. M) Treating RN: Harold Barban Primary Care Maily Debarge: Salome Holmes Other Clinician: Referring Zarin Hagmann: Johny Drilling Treating Ernest Orr/Extender: Tito Dine in Treatment: 0 Primary Learner Assessed: Patient Learning Preferences/Education Level/Primary Language Learning Preference: Explanation Highest Education Level: College or Above Preferred Language: English Cognitive Barrier Language Barrier: No Translator Needed: No Memory Deficit: No Emotional Barrier: No Cultural/Religious Beliefs Affecting Medical Care: No Physical Barrier Impaired Vision: No Impaired Hearing: No Decreased Hand dexterity: No Knowledge/Comprehension Knowledge Level: High Comprehension Level: High Ability to understand written High instructions: Ability to understand verbal High instructions: Motivation Anxiety Level: Calm Cooperation: Cooperative Education Importance: Acknowledges Need Interest in Health Problems: Asks Questions Perception: Coherent Willingness to Engage in Self- High Management Activities: Readiness to Engage in Self- High Management Activities: Electronic Signature(s) Signed: 03/08/2019 5:01:15 PM By: Harold Barban Entered By: Harold Barban on 03/08/2019 15:18:51 Vernon Johnson ([id number]) -------------------------------------------------------------------------------- Fall Risk Assessment Details Patient Name: Vernon Johnson. Date of Service: 03/08/2019 2:45 PM Medical Record Number: [id number] Patient Account Number:  [id number] Date of Birth/Sex: 06-21-1970 (48 y.o. M) Treating RN: Harold Barban Primary Care Cosme Jacob: Salome Holmes Other Clinician: Referring Sirena Riddle: Johny Drilling Treating Braydee Shimkus/Extender: Tito Dine in Treatment: 0 Fall Risk Assessment Items Have you had 2 or more falls in the last 12 monthso 0 No Have you had any fall that resulted in injury in the last 12 monthso 0 No FALLS RISK SCREEN History of falling - immediate or within 3 months 0 No Secondary diagnosis (Do you have 2 or more medical diagnoseso) 0 No Ambulatory aid None/bed rest/wheelchair/nurse 0 No Crutches/cane/walker 15 Yes Furniture 0 No Intravenous therapy Access/Saline/Heparin Lock 0 No Gait/Transferring Normal/ bed rest/ wheelchair 0 No Weak (  short steps with or without shuffle, stooped but able to lift head while 0 No walking, may seek support from furniture) Impaired (short steps with shuffle, may have difficulty arising from chair, head 0 No down, impaired balance) Mental Status Oriented to own ability 0 Yes Electronic Signature(s) Signed: 03/08/2019 5:01:15 PM By: Harold Barban Entered By: Harold Barban on 03/08/2019 15:19:10 Vernon Johnson ([id number]) -------------------------------------------------------------------------------- Foot Assessment Details Patient Name: Vernon Johnson. Date of Service: 03/08/2019 2:45 PM Medical Record Number: [id number] Patient Account Number: [id number] Date of Birth/Sex: 01-07-1971 (48 y.o. M) Treating RN: Harold Barban Primary Care Stefen Juba: Salome Holmes Other Clinician: Referring Kanav Kazmierczak: Johny Drilling Treating Juanmanuel Marohl/Extender: Tito Dine in Treatment: 0 Foot Assessment Items Site Locations + = Sensation present, - = Sensation absent, C = Callus, U = Ulcer R = Redness, W = Warmth, M = Maceration, PU = Pre-ulcerative lesion F = Fissure, S = Swelling, D = Dryness Assessment Right: Left: Other Deformity: No  No Prior Foot Ulcer: No No Prior Amputation: No No Charcot Joint: No No Ambulatory Status: Ambulatory With Help Assistance Device: Cane Gait: Steady Electronic Signature(s) Signed: 03/08/2019 5:01:15 PM By: Harold Barban Entered By: Harold Barban on 03/08/2019 15:20:55 Vernon Johnson ([id number]) -------------------------------------------------------------------------------- Nutrition Risk Screening Details Patient Name: Vernon Johnson. Date of Service: 03/08/2019 2:45 PM Medical Record Number: [id number] Patient Account Number: [id number] Date of Birth/Sex: 1971/02/19 (48 y.o. M) Treating RN: Harold Barban Primary Care Loren Sawaya: Salome Holmes Other Clinician: Referring Keah Lamba: Johny Drilling Treating Sherri Mcarthy/Extender: Tito Dine in Treatment: 0 Height (in): 71 Weight (lbs): 249 Body Mass Index (BMI): 34.7 Nutrition Risk Screening Items Score Screening NUTRITION RISK SCREEN: I have an illness or condition that made me change the kind and/or amount of 0 No food I eat I eat fewer than two meals per day 0 No I eat few fruits and vegetables, or milk products 0 No I have three or more drinks of beer, liquor or wine almost every day 0 No I have tooth or mouth problems that make it hard for me to eat 0 No I don't always have enough money to buy the food I need 0 No I eat alone most of the time 0 No I take three or more different prescribed or over-the-counter drugs a day 1 Yes Without wanting to, I have lost or gained 10 pounds in the last six months 0 No I am not always physically able to shop, cook and/or feed myself 0 No Nutrition Protocols Good Risk Protocol Moderate Risk Protocol High Risk Proctocol Risk Level: Good Risk Score: 1 Electronic Signature(s) Signed: 03/08/2019 5:01:15 PM By: Harold Barban Entered By: Harold Barban on 03/08/2019 15:19:25

## 2019-03-09 NOTE — Progress Notes (Signed)
MORTEZ, MEADOWCROFT ([id number]) Visit Report for 03/08/2019 Allergy List Details Patient Name: KHALIFA, EULL. Date of Service: 03/08/2019 2:45 PM Medical Record Number: [id number] Patient Account Number: [id number] Date of Birth/Sex: 1971/02/20 (48 y.o. M) Treating RN: Harold Barban Primary Care Halah Whiteside: Salome Holmes Other Clinician: Referring Charisma Charlot: Johny Drilling Treating Nakya Weyand/Extender: Ricard Dillon Weeks in Treatment: 0 Allergies Active Allergies No Known Drug Allergies Allergy Notes Electronic Signature(s) Signed: 03/08/2019 5:01:15 PM By: Harold Barban Entered By: Harold Barban on 03/08/2019 15:12:01 [name] ([id number]) -------------------------------------------------------------------------------- [place] Details Patient Name: ADWIN, CARRAHER. Date of Service: 03/08/2019 2:45 PM Medical Record Number: [id number] Patient Account Number: [id number] Date of Birth/Sex: 01/23/1971 (48 y.o. M) Treating RN: Harold Barban Primary Care Desiray Orchard: Salome Holmes Other Clinician: Referring Hamad Whyte: Johny Drilling Treating Katryn Plummer/Extender: Tito Dine in Treatment: 0 Visit Information Patient Arrived: Kasandra Knudsen Arrival Time: 15:05 Accompanied By: wife Transfer Assistance: None Patient Identification Verified: Yes Secondary Verification Process Yes Completed: Patient Has Alerts: Yes Patient Alerts: Patient on Blood Thinner Aspirin 81mg  Electronic Signature(s) Signed: 03/08/2019 3:32:06 PM By: Harold Barban Entered By: Harold Barban on 03/08/2019 15:32:05 [name] ([id number]) -------------------------------------------------------------------------------- Clinic Level of Care Assessment Details Patient Name: [name]. Date of Service: 03/08/2019 2:45 PM Medical Record Number: [id number] Patient Account Number: [id number] Date of Birth/Sex: Apr 01, 1971 (48 y.o. M) Treating RN:  Army Melia Primary Care Vivia Rosenburg: Salome Holmes Other Clinician: Referring Nicol Herbig: Johny Drilling Treating Railyn House/Extender: Tito Dine in Treatment: 0 Clinic Level of Care Assessment Items TOOL 1 Quantity Score []  - Use when EandM and Procedure is performed on INITIAL visit 0 ASSESSMENTS - Nursing Assessment / Reassessment X - General Physical Exam (combine w/ comprehensive assessment (listed just below) when 1 20 performed on new pt. evals) X- 1 25 Comprehensive Assessment (HX, ROS, Risk Assessments, Wounds Hx, etc.) ASSESSMENTS - Wound and Skin Assessment / Reassessment []  - Dermatologic / Skin Assessment (not related to wound area) 0 ASSESSMENTS - Ostomy and/or Continence Assessment and Care []  - Incontinence Assessment and Management 0 []  - 0 Ostomy Care Assessment and Management (repouching, etc.) PROCESS - Coordination of Care X - Simple Patient / Family Education for ongoing care 1 15 []  - 0 Complex (extensive) Patient / Family Education for ongoing care X- 1 10 Staff obtains Programmer, systems, Records, Test Results / Process Orders []  - 0 Staff telephones HHA, Nursing Homes / Clarify orders / etc []  - 0 Routine Transfer to another Facility (non-emergent condition) []  - 0 Routine Hospital Admission (non-emergent condition) X- 1 15 New Admissions / Biomedical engineer / Ordering NPWT, Apligraf, etc. []  - 0 Emergency Hospital Admission (emergent condition) PROCESS - Special Needs []  - Pediatric / Minor Patient Management 0 []  - 0 Isolation Patient Management []  - 0 Hearing / Language / Visual special needs []  - 0 Assessment of Community assistance (transportation, D/C planning, etc.) []  - 0 Additional assistance / Altered mentation []  - 0 Support Surface(s) Assessment (bed, cushion, seat, etc.) DEMENTRIUS, ORZEL ([id number]) INTERVENTIONS - Miscellaneous []  - External ear exam 0 []  - 0 Patient Transfer (multiple staff / Civil Service fast streamer / Similar  devices) []  - 0 Simple Staple / Suture removal (25 or less) []  - 0 Complex Staple / Suture removal (26 or more) []  - 0 Hypo/Hyperglycemic Management (do not check if billed separately) []  - 0 Ankle / Brachial Index (ABI) - do not check if billed separately Has the patient been seen at the hospital within the last three  years: Yes Total Score: 85 Level Of Care: New/Established - Level 3 Electronic Signature(s) Signed: 03/09/2019 9:12:39 AM By: Army Melia Entered By: Army Melia on 03/08/2019 15:43:27 [name] ([id number]) -------------------------------------------------------------------------------- Encounter Discharge Information Details Patient Name: DESHANE, CORKER. Date of Service: 03/08/2019 2:45 PM Medical Record Number: [id number] Patient Account Number: [id number] Date of Birth/Sex: 11-27-1970 (48 y.o. M) Treating RN: Army Melia Primary Care Wilfredo Canterbury: Salome Holmes Other Clinician: Referring Trajon Rosete: Johny Drilling Treating Riaan Toledo/Extender: Tito Dine in Treatment: 0 Encounter Discharge Information Items Post Procedure Vitals Discharge Condition: Stable Temperature (F): 98.5 Ambulatory Status: Ambulatory Pulse (bpm): 105 Discharge Destination: Home Respiratory Rate (breaths/min): 16 Transportation: Private Auto Blood Pressure (mmHg): 149/94 Accompanied By: wife Schedule Follow-up Appointment: No Clinical Summary of Care: Electronic Signature(s) Signed: 03/09/2019 9:12:39 AM By: Army Melia Entered By: Army Melia on 03/08/2019 15:44:59 [name] ([id number]) -------------------------------------------------------------------------------- Lower Extremity Assessment Details Patient Name: [name]. Date of Service: 03/08/2019 2:45 PM Medical Record Number: [id number] Patient Account Number: [id number] Date of Birth/Sex: 12-17-1970 (48 y.o. M) Treating RN: Harold Barban Primary Care Deaunna Olarte: Salome Holmes Other Clinician: Referring Dametri Ozburn: Johny Drilling Treating Phil Corti/Extender: Tito Dine in Treatment: 0 Edema Assessment Assessed: [Left: No] [Right: No] [Left: Edema] [Right: :] Calf Left: Right: Point of Measurement: 35 cm From Medial Instep cm 41 cm Ankle Left: Right: Point of Measurement: 11 cm From Medial Instep cm 28.2 cm Vascular Assessment Pulses: Dorsalis Pedis Palpable: [Right:Yes] Posterior Tibial Palpable: [Right:Yes] Notes No ABI taken due to blister on lower extremity Electronic Signature(s) Signed: 03/08/2019 5:01:15 PM By: Harold Barban Entered By: Harold Barban on 03/08/2019 15:23:14 [name] ([id number]) -------------------------------------------------------------------------------- Multi Wound Chart Details Patient Name: [name]. Date of Service: 03/08/2019 2:45 PM Medical Record Number: [id number] Patient Account Number: [id number] Date of Birth/Sex: 05-17-1970 (48 y.o. M) Treating RN: Army Melia Primary Care Altus Zaino: Salome Holmes Other Clinician: Referring Abria Vannostrand: Johny Drilling Treating Maciah Schweigert/Extender: Tito Dine in Treatment: 0 Vital Signs Height(in): 71 Pulse(bpm): 105 Weight(lbs): 249 Blood Pressure(mmHg): 149/94 Body Mass Index(BMI): 35 Temperature(F): 98.5 Respiratory Rate 18 (breaths/min): Photos: [N/A:N/A] Wound Location: Right Lower Leg - Medial N/A N/A Wounding Event: Trauma N/A N/A Primary Etiology: Diabetic Wound/Ulcer of the N/A N/A Lower Extremity Comorbid History: Sleep Apnea, Type II Diabetes N/A N/A Date Acquired: 02/23/2019 N/A N/A Weeks of Treatment: 0 N/A N/A Wound Status: Open N/A N/A Measurements L x W x D 4.7x5.5x0.1 N/A N/A (cm) Area (cm) : 20.303 N/A N/A Volume (cm) : 2.03 N/A N/A % Reduction in Area: 0.00% N/A N/A % Reduction in Volume: 0.00% N/A N/A Classification: Grade 2 N/A N/A Exudate Amount: Small N/A N/A Exudate Type:  Serosanguineous N/A N/A Exudate Color: red, brown N/A N/A Wound Margin: Indistinct, nonvisible N/A N/A Granulation Amount: None Present (0%) N/A N/A Necrotic Amount: Large (67-100%) N/A N/A Necrotic Tissue: Eschar N/A N/A Exposed Structures: Fascia: No N/A N/A Fat Layer (Subcutaneous Tissue) Exposed: No Tendon: No Muscle: No Joint: No Bone: No EYAL, TANTILLO ([id number]) Epithelialization: None N/A N/A Debridement: Debridement - Selective/Open N/A N/A Wound Pre-procedure 15:36 N/A N/A Verification/Time Out Taken: Pain Control: Lidocaine N/A N/A Level: Skin/Dermis N/A N/A Debridement Area (sq cm): 25.85 N/A N/A Instrument: Blade, Curette, Forceps N/A N/A Bleeding: Minimum N/A N/A Hemostasis Achieved: Pressure N/A N/A Debridement Treatment Procedure was tolerated well N/A N/A Response: Post Debridement 4.7x5.5x0.1 N/A N/A Measurements L x W x D (cm) Post Debridement Volume: 2.03 N/A N/A (cm) Procedures Performed: Debridement  N/A N/A Treatment Notes Wound #1 (Right, Medial Lower Leg) Notes silver ag, ABD, conform, tape Electronic Signature(s) Signed: 03/08/2019 5:39:42 PM By: Linton Ham MD Entered By: Linton Ham on 03/08/2019 15:47:03 [name] ([id number]) -------------------------------------------------------------------------------- [place] Details Patient Name: TRAYSHAUN, CLERE. Date of Service: 03/08/2019 2:45 PM Medical Record Number: [id number] Patient Account Number: [id number] Date of Birth/Sex: Aug 30, 1970 (48 y.o. M) Treating RN: Army Melia Primary Care Delaynee Alred: Salome Holmes Other Clinician: Referring Garrick Midgley: Johny Drilling Treating Tejas Seawood/Extender: Tito Dine in Treatment: 0 Active Inactive Nutrition Nursing Diagnoses: Impaired glucose control: actual or potential Goals: Patient/caregiver verbalizes understanding of need to maintain therapeutic glucose control per primary care  physician Date Initiated: 03/08/2019 Target Resolution Date: 03/30/2019 Goal Status: Active Interventions: Assess HgA1c results as ordered upon admission and as needed Notes: Orientation to the Wound Care Program Nursing Diagnoses: Knowledge deficit related to the wound healing center program Goals: Patient/caregiver will verbalize understanding of the [employer] Date Initiated: 03/08/2019 Target Resolution Date: 03/16/2019 Goal Status: Active Interventions: Provide education on orientation to the wound center Notes: Wound/Skin Impairment Nursing Diagnoses: Impaired tissue integrity Goals: Ulcer/skin breakdown will have a volume reduction of 30% by week 4 Date Initiated: 03/08/2019 Target Resolution Date: 03/30/2019 Goal Status: Active Interventions: Assess ulceration(s) every visit OCTAVIEN, FAGGART ([id number]) Notes: Electronic Signature(s) Signed: 03/09/2019 9:12:39 AM By: Army Melia Entered By: Army Melia on 03/08/2019 15:36:25 [name] ([id number]) -------------------------------------------------------------------------------- Pain Assessment Details Patient Name: [name]. Date of Service: 03/08/2019 2:45 PM Medical Record Number: [id number] Patient Account Number: [id number] Date of Birth/Sex: 02-23-1971 (48 y.o. M) Treating RN: Harold Barban Primary Care Tulip Meharg: Salome Holmes Other Clinician: Referring Hershall Benkert: Johny Drilling Treating Merari Pion/Extender: Tito Dine in Treatment: 0 Active Problems Location of Pain Severity and Description of Pain Patient Has Paino No Site Locations Pain Management and Medication Current Pain Management: Electronic Signature(s) Signed: 03/08/2019 5:01:15 PM By: Harold Barban Entered By: Harold Barban on 03/08/2019 15:09:50 [name] ([id number]) -------------------------------------------------------------------------------- Patient/Caregiver  Education Details Patient Name: ZANDEN, BURGERS. Date of Service: 03/08/2019 2:45 PM Medical Record Number: [id number] Patient Account Number: [id number] Date of Birth/Gender: [birth year] (48 y.o. M) Treating RN: Army Melia Primary Care Physician: Salome Holmes Other Clinician: Referring Physician: Johny Drilling Treating Physician/Extender: Tito Dine in Treatment: 0 Education Assessment Education Provided To: Patient Education Topics Provided Wound/Skin Impairment: Handouts: Caring for Your Ulcer Methods: Demonstration, Explain/Verbal Responses: State content correctly Electronic Signature(s) Signed: 03/09/2019 9:12:39 AM By: Army Melia Entered By: Army Melia on 03/08/2019 15:43:42 [name] ([id number]) -------------------------------------------------------------------------------- Wound Assessment Details Patient Name: MONDALE, DUTCHER. Date of Service: 03/08/2019 2:45 PM Medical Record Number: [id number] Patient Account Number: [id number] Date of Birth/Sex: 1970/08/20 (48 y.o. M) Treating RN: Harold Barban Primary Care Jazelle Achey: Salome Holmes Other Clinician: Referring Berda Shelvin: Johny Drilling Treating Jamye Balicki/Extender: Tito Dine in Treatment: 0 Wound Status Wound Number: 1 Primary Etiology: Diabetic Wound/Ulcer of the Lower Extremity Wound Location: Right Lower Leg - Medial Wound Status: Open Wounding Event: Trauma Comorbid Sleep Apnea, Type II Diabetes Date Acquired: 02/23/2019 History: Weeks Of Treatment: 0 Clustered Wound: No Photos Wound Measurements Length: (cm) 4.7 % Reduction Width: (cm) 5.5 % Reduction Depth: (cm) 0.1 Epithelializ Area: (cm) 20.303 Tunneling: Volume: (cm) 2.03 Undermining in Area: 0% in Volume: 0% ation: None No : No Wound Description Classification: Grade 2 Foul Odor Af Wound Margin: Indistinct, nonvisible Slough/Fibri Exudate Amount: Small Exudate Type:  Serosanguineous Exudate Color: red,  brown ter Cleansing: No no Yes Wound Bed Granulation Amount: None Present (0%) Exposed Structure Necrotic Amount: Large (67-100%) Fascia Exposed: No Necrotic Quality: Eschar Fat Layer (Subcutaneous Tissue) Exposed: No Tendon Exposed: No Muscle Exposed: No Joint Exposed: No Bone Exposed: No Treatment Notes JAIS, RUBI ([id number]) Wound #1 (Right, Medial Lower Leg) Notes silver ag, ABD, conform, tape Electronic Signature(s) Signed: 03/08/2019 5:01:15 PM By: Harold Barban Entered By: Harold Barban on 03/08/2019 15:26:22 [name] ([id number]) -------------------------------------------------------------------------------- [place] Details Patient Name: [name]. Date of Service: 03/08/2019 2:45 PM Medical Record Number: [id number] Patient Account Number: [id number] Date of Birth/Sex: 1971-05-06 (48 y.o. M) Treating RN: Harold Barban Primary Care Akeel Reffner: Salome Holmes Other Clinician: Referring Makayah Pauli: Johny Drilling Treating Virgle Arth/Extender: Tito Dine in Treatment: 0 Vital Signs Time Taken: 15:10 Temperature (F): 98.5 Height (in): 71 Pulse (bpm): 105 Source: Stated Respiratory Rate (breaths/min): 18 Weight (lbs): 249 Blood Pressure (mmHg): 149/94 Source: Stated Reference Range: 80 - 120 mg / dl Body Mass Index (BMI): 34.7 Electronic Signature(s) Signed: 03/08/2019 5:01:15 PM By: Harold Barban Entered By: Harold Barban on 03/08/2019 15:10:25
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
## 2019-03-15 ENCOUNTER — Encounter: Payer: 59 | Attending: Physician Assistant | Admitting: Physician Assistant

## 2019-03-15 ENCOUNTER — Other Ambulatory Visit: Payer: Self-pay

## 2019-03-15 DIAGNOSIS — L97811 Non-pressure chronic ulcer of other part of right lower leg limited to breakdown of skin: Secondary | ICD-10-CM | POA: Diagnosis not present

## 2019-03-15 DIAGNOSIS — K760 Fatty (change of) liver, not elsewhere classified: Secondary | ICD-10-CM | POA: Diagnosis not present

## 2019-03-15 DIAGNOSIS — Z6834 Body mass index (BMI) 34.0-34.9, adult: Secondary | ICD-10-CM | POA: Insufficient documentation

## 2019-03-15 DIAGNOSIS — E11622 Type 2 diabetes mellitus with other skin ulcer: Secondary | ICD-10-CM | POA: Diagnosis present

## 2019-03-15 DIAGNOSIS — E669 Obesity, unspecified: Secondary | ICD-10-CM | POA: Diagnosis not present

## 2019-03-15 DIAGNOSIS — G4733 Obstructive sleep apnea (adult) (pediatric): Secondary | ICD-10-CM | POA: Diagnosis not present

## 2019-03-15 DIAGNOSIS — Z87891 Personal history of nicotine dependence: Secondary | ICD-10-CM | POA: Diagnosis not present

## 2019-03-15 NOTE — Progress Notes (Signed)
MICHAELPAUL, FOLKER ([id number]) Visit Report for 03/15/2019 Arrival Information Details Patient Name: Vernon Johnson, Vernon Johnson. Date of Service: 03/15/2019 12:30 PM Medical Record Number: [id number] Patient Account Number: [id number] Date of Birth/Sex: Mar 01, 1971 (48 y.o. M) Treating RN: Army Melia Primary Care Lopaka Karge: Salome Holmes Other Clinician: Referring Boykin Baetz: Salome Holmes Treating Dailyn Reith/Extender: Melburn Hake, HOYT Weeks in Treatment: 1 Visit Information History Since Last Visit Added or deleted any medications: No Patient Arrived: Cane Any new allergies or adverse reactions: No Arrival Time: 12:38 Had a fall or experienced change in No Accompanied By: wife activities of daily living that may affect Transfer Assistance: None risk of falls: Patient Identification Verified: Yes Signs or symptoms of abuse/neglect since last visito No Secondary Verification Process Yes Hospitalized since last visit: No Completed: Implantable device outside of the clinic excluding No Patient Has Alerts: Yes cellular tissue based products placed in the center Patient Alerts: Patient on Blood since last visit: Thinner Has Dressing in Place as Prescribed: Yes Aspirin 81mg  Pain Present Now: Yes Electronic Signature(s) Signed: 03/15/2019 4:57:32 PM By: Lorine Bears RCP, RRT, CHT Entered By: Lorine Bears on 03/15/2019 12:39:24 [name] ([id number]) -------------------------------------------------------------------------------- Clinic Level of Care Assessment Details Patient Name: Vernon Johnson. Date of Service: 03/15/2019 12:30 PM Medical Record Number: [id number] Patient Account Number: [id number] Date of Birth/Sex: 1970-12-08 (48 y.o. M) Treating RN: Army Melia Primary Care Kacey Dysert: Salome Holmes Other Clinician: Referring Anup Brigham: Salome Holmes Treating Stonewall Doss/Extender: Melburn Hake, HOYT Weeks in Treatment: 1 Clinic Level of  Care Assessment Items TOOL 4 Quantity Score []  - Use when only an EandM is performed on FOLLOW-UP visit 0 ASSESSMENTS - Nursing Assessment / Reassessment X - Reassessment of Co-morbidities (includes updates in patient status) 1 10 X- 1 5 Reassessment of Adherence to Treatment Plan ASSESSMENTS - Wound and Skin Assessment / Reassessment X - Simple Wound Assessment / Reassessment - one wound 1 5 []  - 0 Complex Wound Assessment / Reassessment - multiple wounds []  - 0 Dermatologic / Skin Assessment (not related to wound area) ASSESSMENTS - Focused Assessment []  - Circumferential Edema Measurements - multi extremities 0 []  - 0 Nutritional Assessment / Counseling / Intervention []  - 0 Lower Extremity Assessment (monofilament, tuning fork, pulses) []  - 0 Peripheral Arterial Disease Assessment (using hand held doppler) ASSESSMENTS - Ostomy and/or Continence Assessment and Care []  - Incontinence Assessment and Management 0 []  - 0 Ostomy Care Assessment and Management (repouching, etc.) PROCESS - Coordination of Care X - Simple Patient / Family Education for ongoing care 1 15 []  - 0 Complex (extensive) Patient / Family Education for ongoing care X- 1 10 Staff obtains Programmer, systems, Records, Test Results / Process Orders []  - 0 Staff telephones HHA, Nursing Homes / Clarify orders / etc []  - 0 Routine Transfer to another Facility (non-emergent condition) []  - 0 Routine Hospital Admission (non-emergent condition) []  - 0 New Admissions / Biomedical engineer / Ordering NPWT, Apligraf, etc. []  - 0 Emergency Hospital Admission (emergent condition) X- 1 10 Simple Discharge Coordination LUCKIE, OKABE ([id number]) []  - 0 Complex (extensive) Discharge Coordination PROCESS - Special Needs []  - Pediatric / Minor Patient Management 0 []  - 0 Isolation Patient Management []  - 0 Hearing / Language / Visual special needs []  - 0 Assessment of Community assistance (transportation, D/C  planning, etc.) []  - 0 Additional assistance / Altered mentation []  - 0 Support Surface(s) Assessment (bed, cushion, seat, etc.) INTERVENTIONS - Wound Cleansing / Measurement X - Simple Wound Cleansing - one  wound 1 5 []  - 0 Complex Wound Cleansing - multiple wounds X- 1 5 Wound Imaging (photographs - any number of wounds) []  - 0 Wound Tracing (instead of photographs) X- 1 5 Simple Wound Measurement - one wound []  - 0 Complex Wound Measurement - multiple wounds INTERVENTIONS - Wound Dressings []  - Small Wound Dressing one or multiple wounds 0 X- 1 15 Medium Wound Dressing one or multiple wounds []  - 0 Large Wound Dressing one or multiple wounds []  - 0 Application of Medications - topical []  - 0 Application of Medications - injection INTERVENTIONS - Miscellaneous []  - External ear exam 0 []  - 0 Specimen Collection (cultures, biopsies, blood, body fluids, etc.) []  - 0 Specimen(s) / Culture(s) sent or taken to Lab for analysis []  - 0 Patient Transfer (multiple staff / Civil Service fast streamer / Similar devices) []  - 0 Simple Staple / Suture removal (25 or less) []  - 0 Complex Staple / Suture removal (26 or more) []  - 0 Hypo / Hyperglycemic Management (close monitor of Blood Glucose) []  - 0 Ankle / Brachial Index (ABI) - do not check if billed separately X- 1 5 Vital Signs DARIKSON, NAGY ([id number]) Has the patient been seen at the hospital within the last three years: Yes Total Score: 90 Level Of Care: New/Established - Level 3 Electronic Signature(s) Signed: 03/15/2019 4:39:17 PM By: Army Melia Entered By: Army Melia on 03/15/2019 13:00:55 [name] ([id number]) -------------------------------------------------------------------------------- Encounter Discharge Information Details Patient Name: Vernon Johnson. Date of Service: 03/15/2019 12:30 PM Medical Record Number: [id number] Patient Account Number: [id number] Date of Birth/Sex: 24-Sep-1970 (48  y.o. M) Treating RN: Army Melia Primary Care Vianne Grieshop: Salome Holmes Other Clinician: Referring Arliss Frisina: Salome Holmes Treating Trula Frede/Extender: Melburn Hake, HOYT Weeks in Treatment: 1 Encounter Discharge Information Items Discharge Condition: Stable Ambulatory Status: Ambulatory Discharge Destination: Home Transportation: Private Auto Accompanied By: wife Schedule Follow-up Appointment: Yes Clinical Summary of Care: Electronic Signature(s) Signed: 03/15/2019 4:39:17 PM By: Army Melia Entered By: Army Melia on 03/15/2019 13:01:36 [name] ([id number]) -------------------------------------------------------------------------------- Lower Extremity Assessment Details Patient Name: JAHZIAH, SICLARI. Date of Service: 03/15/2019 12:30 PM Medical Record Number: [id number] Patient Account Number: [id number] Date of Birth/Sex: 1970-09-25 (48 y.o. M) Treating RN: Harold Barban Primary Care Sheddrick Lattanzio: Salome Holmes Other Clinician: Referring Cerria Randhawa: Salome Holmes Treating Kippy Melena/Extender: Melburn Hake, HOYT Weeks in Treatment: 1 Vascular Assessment Pulses: Dorsalis Pedis Palpable: [Right:Yes] Posterior Tibial Palpable: [Right:Yes] Electronic Signature(s) Signed: 03/15/2019 4:15:06 PM By: Harold Barban Entered By: Harold Barban on 03/15/2019 12:45:00 [name] ([id number]) -------------------------------------------------------------------------------- Multi Wound Chart Details Patient Name: [name]. Date of Service: 03/15/2019 12:30 PM Medical Record Number: [id number] Patient Account Number: [id number] Date of Birth/Sex: March 13, 1971 (48 y.o. M) Treating RN: Army Melia Primary Care Jermond Burkemper: Salome Holmes Other Clinician: Referring Alyda Megna: Salome Holmes Treating Lael Pilch/Extender: Melburn Hake, HOYT Weeks in Treatment: 1 Vital Signs Height(in): 71 Pulse(bpm): 105 Weight(lbs): 249 Blood Pressure(mmHg): 132/80 Body Mass  Index(BMI): 35 Temperature(F): 98.6 Respiratory Rate 16 (breaths/min): Photos: [N/A:N/A] Wound Location: Right Lower Leg - Medial N/A N/A Wounding Event: Trauma N/A N/A Primary Etiology: Diabetic Wound/Ulcer of the N/A N/A Lower Extremity Comorbid History: Sleep Apnea, Type II Diabetes N/A N/A Date Acquired: 02/23/2019 N/A N/A Weeks of Treatment: 1 N/A N/A Wound Status: Open N/A N/A Measurements L x W x D 4.5x5.5x0.1 N/A N/A (cm) Area (cm) : 19.439 N/A N/A Volume (cm) : 1.944 N/A N/A % Reduction in Area: 4.30% N/A N/A % Reduction in Volume: 4.20% N/A N/A  Classification: Grade 2 N/A N/A Exudate Amount: Small N/A N/A Exudate Type: Serosanguineous N/A N/A Exudate Color: red, brown N/A N/A Wound Margin: Indistinct, nonvisible N/A N/A Granulation Amount: None Present (0%) N/A N/A Necrotic Amount: Large (67-100%) N/A N/A Necrotic Tissue: Eschar N/A N/A Exposed Structures: Fascia: No N/A N/A Fat Layer (Subcutaneous Tissue) Exposed: No Tendon: No Muscle: No Joint: No Bone: No KAYLER, KOSKY ([id number]) Epithelialization: None N/A N/A Treatment Notes Electronic Signature(s) Signed: 03/15/2019 4:39:17 PM By: Army Melia Entered By: Army Melia on 03/15/2019 12:50:44 [name] ([id number]) -------------------------------------------------------------------------------- Multi-Disciplinary Care Plan Details Patient Name: YAAKOV, SEPPALA. Date of Service: 03/15/2019 12:30 PM Medical Record Number: [id number] Patient Account Number: [id number] Date of Birth/Sex: 1970-06-03 (48 y.o. M) Treating RN: Army Melia Primary Care Garren Greenman: Salome Holmes Other Clinician: Referring Gaudencio Chesnut: Salome Holmes Treating Bri Wakeman/Extender: Melburn Hake, HOYT Weeks in Treatment: 1 Active Inactive Nutrition Nursing Diagnoses: Impaired glucose control: actual or potential Goals: Patient/caregiver verbalizes understanding of need to maintain therapeutic glucose control  per primary care physician Date Initiated: 03/08/2019 Target Resolution Date: 03/30/2019 Goal Status: Active Interventions: Assess HgA1c results as ordered upon admission and as needed Notes: Orientation to the Wound Care Program Nursing Diagnoses: Knowledge deficit related to the wound healing center program Goals: Patient/caregiver will verbalize understanding of the [place] Date Initiated: 03/08/2019 Target Resolution Date: 03/16/2019 Goal Status: Active Interventions: Provide education on orientation to the wound center Notes: Wound/Skin Impairment Nursing Diagnoses: Impaired tissue integrity Goals: Ulcer/skin breakdown will have a volume reduction of 30% by week 4 Date Initiated: 03/08/2019 Target Resolution Date: 03/30/2019 Goal Status: Active Interventions: Assess ulceration(s) every visit ARNOL, COPPEDGE ([id number]) Notes: Electronic Signature(s) Signed: 03/15/2019 4:39:17 PM By: Army Melia Entered By: Army Melia on 03/15/2019 12:50:08 [name] ([id number]) -------------------------------------------------------------------------------- Pain Assessment Details Patient Name: [name]. Date of Service: 03/15/2019 12:30 PM Medical Record Number: [id number] Patient Account Number: [id number] Date of Birth/Sex: 09-15-1970 (48 y.o. M) Treating RN: Army Melia Primary Care Herberto Ledwell: Salome Holmes Other Clinician: Referring Anival Pasha: Salome Holmes Treating Hildagarde Holleran/Extender: Melburn Hake, HOYT Weeks in Treatment: 1 Active Problems Location of Pain Severity and Description of Pain Patient Has Paino Yes Site Locations Rate the pain. Current Pain Level: 6 Pain Management and Medication Current Pain Management: Electronic Signature(s) Signed: 03/15/2019 4:39:17 PM By: Army Melia Signed: 03/15/2019 4:57:32 PM By: Becky Sax, Sallie RCP, RRT, CHT Entered By: Lorine Bears on 03/15/2019  12:39:37 [name] ([id number]) -------------------------------------------------------------------------------- Patient/Caregiver Education Details Patient Name: ELIZIAH, UMBERGER. Date of Service: 03/15/2019 12:30 PM Medical Record Number: [id number] Patient Account Number: [id number] Date of Birth/Gender: 1970-07-15 (48 y.o. M) Treating RN: Army Melia Primary Care Physician: Salome Holmes Other Clinician: Referring Physician: Salome Holmes Treating Physician/Extender: Sharalyn Ink in Treatment: 1 Education Assessment Education Provided To: Patient Education Topics Provided Wound/Skin Impairment: Handouts: Caring for Your Ulcer Methods: Demonstration, Explain/Verbal Responses: State content correctly Electronic Signature(s) Signed: 03/15/2019 4:39:17 PM By: Army Melia Entered By: Army Melia on 03/15/2019 13:01:05 [name] ([id number]) -------------------------------------------------------------------------------- Wound Assessment Details Patient Name: EFREM, RYKEN. Date of Service: 03/15/2019 12:30 PM Medical Record Number: [id number] Patient Account Number: [id number] Date of Birth/Sex: 17-Nov-1970 (48 y.o. M) Treating RN: Army Melia Primary Care Waverly Chavarria: Salome Holmes Other Clinician: Referring Jesua Tamblyn: Salome Holmes Treating Huy Majid/Extender: Melburn Hake, HOYT Weeks in Treatment: 1 Wound Status Wound Number: 1 Primary Etiology: Diabetic Wound/Ulcer of the Lower Extremity Wound Location: Right, Medial Lower Leg Wound Status: Open Wounding Event: Trauma  Comorbid Sleep Apnea, Type II Diabetes Date Acquired: 02/23/2019 History: Weeks Of Treatment: 1 Clustered Wound: No Photos Wound Measurements Length: (cm) 0.1 % Reduction Width: (cm) 0.1 % Reduction Depth: (cm) 0.1 Epithelializ Area: (cm) 0.008 Tunneling: Volume: (cm) 0.001 Undermining in Area: 100% in Volume: 100% ation: None No : No Wound  Description Classification: Grade 2 Foul Odor Af Wound Margin: Indistinct, nonvisible Slough/Fibri Exudate Amount: Small Exudate Type: Serosanguineous Exudate Color: red, brown ter Cleansing: No no Yes Wound Bed Granulation Amount: None Present (0%) Exposed Structure Necrotic Amount: Large (67-100%) Fascia Exposed: No Necrotic Quality: Eschar Fat Layer (Subcutaneous Tissue) Exposed: No Tendon Exposed: No Muscle Exposed: No Joint Exposed: No Bone Exposed: No Treatment Notes USTIN, TENNENBAUM ([id number]) Wound #1 (Right, Medial Lower Leg) Notes silver ag, ABD, conform, tape, ace wrap Electronic Signature(s) Signed: 03/15/2019 4:39:17 PM By: Army Melia Entered By: Army Melia on 03/15/2019 12:55:58 [name] ([id number]) -------------------------------------------------------------------------------- Vitals Details Patient Name: [name]. Date of Service: 03/15/2019 12:30 PM Medical Record Number: [id number] Patient Account Number: [id number] Date of Birth/Sex: [birth year] (48 y.o. M) Treating RN: Army Melia Primary Care Shailee Foots: Salome Holmes Other Clinician: Referring Ayeshia Coppin: Salome Holmes Treating Joseandres Mazer/Extender: Melburn Hake, HOYT Weeks in Treatment: 1 Vital Signs Time Taken: 12:35 Temperature (F): 98.6 Height (in): 71 Pulse (bpm): 105 Weight (lbs): 249 Respiratory Rate (breaths/min): 16 Body Mass Index (BMI): 34.7 Blood Pressure (mmHg): 132/80 Reference Range: 80 - 120 mg / dl Electronic Signature(s) Signed: 03/15/2019 4:57:32 PM By: Lorine Bears RCP, RRT, CHT Entered By: Lorine Bears on 03/15/2019 12:40:12
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
## 2019-03-22 ENCOUNTER — Encounter: Payer: 59 | Admitting: Physician Assistant

## 2019-03-22 ENCOUNTER — Other Ambulatory Visit: Payer: Self-pay

## 2019-03-22 DIAGNOSIS — E11622 Type 2 diabetes mellitus with other skin ulcer: Secondary | ICD-10-CM | POA: Diagnosis not present
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
## 2019-03-23 NOTE — Progress Notes (Signed)
RADIN, BUKHARI ([id number]) Visit Report for 03/22/2019 Arrival Information Details Patient Name: Vernon Johnson, Vernon Johnson. Date of Service: 03/22/2019 8:45 AM Medical Record Number: [id number] Patient Account Number: [id number] Date of Birth/Sex: 1971/01/20 (48 y.o. M) Treating RN: Vernon Johnson Primary Care Vernon Johnson: Vernon Johnson Other Clinician: Referring Vernon Johnson: Vernon Johnson Treating Vernon Johnson: Vernon Johnson Weeks in Treatment: 2 Visit Information History Since Last Visit Added or deleted any medications: No Patient Arrived: Ambulatory Any new allergies or adverse reactions: No Arrival Time: 08:35 Had a fall or experienced change in No Accompanied By: self activities of daily living that may affect Transfer Assistance: None risk of falls: Patient Identification Verified: Yes Signs or symptoms of abuse/neglect since last visito No Secondary Verification Process Yes Hospitalized since last visit: No Completed: Implantable device outside of the clinic excluding No Patient Has Alerts: Yes cellular tissue based products placed in the center Patient Alerts: Patient on Blood since last visit: Thinner Pain Present Now: No Aspirin 81mg  Electronic Signature(s) Signed: 03/23/2019 3:15:39 PM By: Vernon Cool, BSN, RN, CWS, Kim RN, BSN Entered By: Vernon Johnson on 03/22/2019 08:36:33 Vernon Johnson ([id number]) -------------------------------------------------------------------------------- Clinic Level of Care Assessment Details Patient Name: Vernon Johnson, Vernon Johnson. Date of Service: 03/22/2019 8:45 AM Medical Record Number: [id number] Patient Account Number: [id number] Date of Birth/Sex: 03-14-1971 (48 y.o. M) Treating RN: Vernon Johnson Primary Care Vernon Johnson: Vernon Johnson Other Clinician: Referring Vernon Johnson: Vernon Johnson Treating Vernon Johnson/Extender: Vernon Johnson Weeks in Treatment: 2 Clinic Level of Care Assessment Items TOOL 4 Quantity Score []  -  Use when only an EandM is performed on FOLLOW-UP visit 0 ASSESSMENTS - Nursing Assessment / Reassessment X - Reassessment of Co-morbidities (includes updates in patient status) 1 10 X- 1 5 Reassessment of Adherence to Treatment Plan ASSESSMENTS - Wound and Skin Assessment / Reassessment X - Simple Wound Assessment / Reassessment - one wound 1 5 []  - 0 Complex Wound Assessment / Reassessment - multiple wounds []  - 0 Dermatologic / Skin Assessment (not related to wound area) ASSESSMENTS - Focused Assessment []  - Circumferential Edema Measurements - multi extremities 0 []  - 0 Nutritional Assessment / Counseling / Intervention X- 1 5 Lower Extremity Assessment (monofilament, tuning fork, pulses) []  - 0 Peripheral Arterial Disease Assessment (using hand held doppler) ASSESSMENTS - Ostomy and/or Continence Assessment and Care []  - Incontinence Assessment and Management 0 []  - 0 Ostomy Care Assessment and Management (repouching, etc.) PROCESS - Coordination of Care X - Simple Patient / Family Education for ongoing care 1 15 []  - 0 Complex (extensive) Patient / Family Education for ongoing care X- 1 10 Staff obtains Programmer, systems, Records, Test Results / Process Orders []  - 0 Staff telephones HHA, Nursing Homes / Clarify orders / etc []  - 0 Routine Transfer to another Facility (non-emergent condition) []  - 0 Routine Hospital Admission (non-emergent condition) []  - 0 New Admissions / Biomedical engineer / Ordering NPWT, Apligraf, etc. []  - 0 Emergency Hospital Admission (emergent condition) X- 1 10 Simple Discharge Coordination Vernon Johnson ([id number]) []  - 0 Complex (extensive) Discharge Coordination PROCESS - Special Needs []  - Pediatric / Minor Patient Management 0 []  - 0 Isolation Patient Management []  - 0 Hearing / Language / Visual special needs []  - 0 Assessment of Community assistance (transportation, D/C planning, etc.) []  - 0 Additional assistance /  Altered mentation []  - 0 Support Surface(s) Assessment (bed, cushion, seat, etc.) INTERVENTIONS - Wound Cleansing / Measurement X - Simple Wound Cleansing - one wound 1 5 []  -  0 Complex Wound Cleansing - multiple wounds X- 1 5 Wound Imaging (photographs - any number of wounds) []  - 0 Wound Tracing (instead of photographs) X- 1 5 Simple Wound Measurement - one wound []  - 0 Complex Wound Measurement - multiple wounds INTERVENTIONS - Wound Dressings []  - Small Wound Dressing one or multiple wounds 0 []  - 0 Medium Wound Dressing one or multiple wounds []  - 0 Large Wound Dressing one or multiple wounds []  - 0 Application of Medications - topical []  - 0 Application of Medications - injection INTERVENTIONS - Miscellaneous []  - External ear exam 0 []  - 0 Specimen Collection (cultures, biopsies, blood, body fluids, etc.) []  - 0 Specimen(s) / Culture(s) sent or taken to Lab for analysis []  - 0 Patient Transfer (multiple staff / Civil Service fast streamer / Similar devices) []  - 0 Simple Staple / Suture removal (25 or less) []  - 0 Complex Staple / Suture removal (26 or more) []  - 0 Hypo / Hyperglycemic Management (close monitor of Blood Glucose) []  - 0 Ankle / Brachial Index (ABI) - do not check if billed separately X- 1 5 Vital Signs Vernon Johnson, Vernon Johnson ([id number]) Has the patient been seen at the hospital within the last three years: Yes Total Score: 80 Level Of Care: New/Established - Level 3 Electronic Signature(s) Signed: 03/22/2019 3:26:34 PM By: Vernon Johnson Entered By: Vernon Johnson on 03/22/2019 08:55:07 Vernon Johnson ([id number]) -------------------------------------------------------------------------------- Encounter Discharge Information Details Patient Name: Vernon Johnson, Vernon Johnson. Date of Service: 03/22/2019 8:45 AM Medical Record Number: [id number] Patient Account Number: [id number] Date of Birth/Sex: August 05, 1970 (48 y.o. M) Treating RN: Vernon Johnson Primary Care  Rajeev Escue: Vernon Johnson Other Clinician: Referring Harshith Pursell: Vernon Johnson Treating Adelis Docter/Extender: Vernon Johnson Weeks in Treatment: 2 Encounter Discharge Information Items Discharge Condition: Stable Ambulatory Status: Ambulatory Discharge Destination: Home Transportation: Private Auto Accompanied By: self Schedule Follow-up Appointment: Yes Clinical Summary of Care: Electronic Signature(s) Signed: 03/22/2019 3:26:34 PM By: Vernon Johnson Entered By: Vernon Johnson on 03/22/2019 08:56:06 Vernon Johnson ([id number]) -------------------------------------------------------------------------------- Lower Extremity Assessment Details Patient Name: Vernon Johnson, Vernon Johnson. Date of Service: 03/22/2019 8:45 AM Medical Record Number: [id number] Patient Account Number: [id number] Date of Birth/Sex: 13-Jul-1970 (48 y.o. M) Treating RN: Vernon Johnson Primary Care Audyn Dimercurio: Vernon Johnson Other Clinician: Referring Ermagene Saidi: Vernon Johnson Treating Fredis Malkiewicz/Extender: Vernon Johnson Weeks in Treatment: 2 Vascular Assessment Pulses: Dorsalis Pedis Palpable: [Right:Yes] Electronic Signature(s) Signed: 03/23/2019 3:15:39 PM By: Vernon Cool, BSN, RN, CWS, Kim RN, BSN Entered By: Vernon Johnson on 03/22/2019 08:42:09 Vernon Johnson ([id number]) -------------------------------------------------------------------------------- Multi Wound Chart Details Patient Name: Vernon Johnson, Vernon Johnson. Date of Service: 03/22/2019 8:45 AM Medical Record Number: [id number] Patient Account Number: [id number] Date of Birth/Sex: 1970/06/29 (48 y.o. M) Treating RN: Vernon Johnson Primary Care Yemariam Magar: Vernon Johnson Other Clinician: Referring Genevive Printup: Vernon Johnson Treating Lyndel Sarate/Extender: Vernon Johnson Weeks in Treatment: 2 Vital Signs Height(in): 71 Pulse(bpm): 106 Weight(lbs): 249 Blood Pressure(mmHg): 127/75 Body Mass Index(BMI): 35 Temperature(F): 99.1 Respiratory  Rate 16 (breaths/min): Photos: [N/A:N/A] Wound Location: Right Lower Leg - Medial N/A N/A Wounding Event: Trauma N/A N/A Primary Etiology: Diabetic Wound/Ulcer of the N/A N/A Lower Extremity Comorbid History: Sleep Apnea, Type II Diabetes N/A N/A Date Acquired: 02/23/2019 N/A N/A Weeks of Treatment: 2 N/A N/A Wound Status: Open N/A N/A Measurements L x W x D 1.4x1x0.1 N/A N/A (cm) Area (cm) : 1.1 N/A N/A Volume (cm) : 0.11 N/A N/A % Reduction in Area: 94.60% N/A N/A % Reduction in Volume: 94.60% N/A N/A Classification:  Grade 2 N/A N/A Exudate Amount: Small N/A N/A Exudate Type: Serosanguineous N/A N/A Exudate Color: red, brown N/A N/A Wound Margin: Indistinct, nonvisible N/A N/A Granulation Amount: None Present (0%) N/A N/A Necrotic Amount: Large (67-100%) N/A N/A Necrotic Tissue: Eschar N/A N/A Exposed Structures: Fascia: No N/A N/A Fat Layer (Subcutaneous Tissue) Exposed: No Tendon: No Muscle: No Joint: No Bone: No YAHSIR, BALLERINI ([id number]) Epithelialization: Small (1-33%) N/A N/A Treatment Notes Electronic Signature(s) Signed: 03/22/2019 3:26:34 PM By: Vernon Johnson Entered By: Vernon Johnson on 03/22/2019 08:45:58 Vernon Johnson ([id number]) -------------------------------------------------------------------------------- [place] Details Patient Name: Vernon Johnson, Vernon Johnson. Date of Service: 03/22/2019 8:45 AM Medical Record Number: [id number] Patient Account Number: [id number] Date of Birth/Sex: Oct 16, 1970 (48 y.o. M) Treating RN: Vernon Johnson Primary Care Connee Ikner: Vernon Johnson Other Clinician: Referring Daniyla Pfahler: Vernon Johnson Treating Maeby Vankleeck/Extender: Vernon Johnson Weeks in Treatment: 2 Active Inactive Electronic Signature(s) Signed: 03/22/2019 3:26:34 PM By: Vernon Johnson Entered By: Vernon Johnson on 03/22/2019 08:50:42 Vernon Johnson  ([id number]) -------------------------------------------------------------------------------- Pain Assessment Details Patient Name: Vernon Johnson, Vernon Johnson. Date of Service: 03/22/2019 8:45 AM Medical Record Number: [id number] Patient Account Number: [id number] Date of Birth/Sex: 28-Feb-1971 (48 y.o. M) Treating RN: Vernon Johnson Primary Care Bert Ptacek: Vernon Johnson Other Clinician: Referring Khori Rosevear: Vernon Johnson Treating Tabria Steines/Extender: Vernon Johnson Weeks in Treatment: 2 Active Problems Location of Pain Severity and Description of Pain Patient Has Paino No Site Locations Pain Management and Medication Current Pain Management: Goals for Pain Management Patient denies pain at this time. Electronic Signature(s) Signed: 03/23/2019 3:15:39 PM By: Vernon Cool, BSN, RN, CWS, Kim RN, BSN Entered By: Vernon Johnson on 03/22/2019 08:36:52 Vernon Johnson ([id number]) -------------------------------------------------------------------------------- Patient/Caregiver Education Details Patient Name: Vernon Johnson, Vernon Johnson. Date of Service: 03/22/2019 8:45 AM Medical Record Number: [id number] Patient Account Number: [id number] Date of Birth/Gender: Oct 22, 1970 (48 y.o. M) Treating RN: Vernon Johnson Primary Care Physician: Vernon Johnson Other Clinician: Referring Physician: Salome Johnson Treating Physician/Extender: Sharalyn Ink in Treatment: 2 Education Assessment Education Provided To: Patient Education Topics Provided Wound/Skin Impairment: Handouts: Caring for Your Ulcer Methods: Demonstration, Explain/Verbal Responses: State content correctly Electronic Signature(s) Signed: 03/22/2019 3:26:34 PM By: Vernon Johnson Entered By: Vernon Johnson on 03/22/2019 08:55:40 Vernon Johnson ([id number]) -------------------------------------------------------------------------------- Wound Assessment Details Patient Name: Vernon Johnson, Vernon Johnson. Date of Service: 03/22/2019  8:45 AM Medical Record Number: [id number] Patient Account Number: [id number] Date of Birth/Sex: [birth year] (48 y.o. M) Treating RN: Vernon Johnson Primary Care Osaze Hubbert: Vernon Johnson Other Clinician: Referring Reed Eifert: Vernon Johnson Treating Hayla Hinger/Extender: Vernon Johnson Weeks in Treatment: 2 Wound Status Wound Number: 1 Primary Etiology: Diabetic Wound/Ulcer of the Lower Extremity Wound Location: Right, Medial Lower Leg Wound Status: Healed - Epithelialized Wounding Event: Trauma Comorbid Sleep Apnea, Type II Diabetes Date Acquired: 02/23/2019 History: Weeks Of Treatment: 2 Clustered Wound: No Photos Wound Measurements Length: (cm) 0 % Reduct Width: (cm) 0 % Reduct Depth: (cm) 0 Epitheli Area: (cm) 0 Tunneli Volume: (cm) 0 Undermi ion in Area: 100% ion in Volume: 100% alization: Small (1-33%) ng: No ning: No Wound Description Classification: Grade 2 Foul Odo Wound Margin: Indistinct, nonvisible Slough/F Exudate Amount: Small Exudate Type: Serosanguineous Exudate Color: red, brown r After Cleansing: No ibrino No Wound Bed Granulation Amount: None Present (0%) Exposed Structure Necrotic Amount: Large (67-100%) Fascia Exposed: No Necrotic Quality: Eschar Fat Layer (Subcutaneous Tissue) Exposed: No Tendon Exposed: No Muscle Exposed: No Joint Exposed: No Vernon Johnson, Vernon Johnson ([id number]) Bone Exposed: No Electronic Signature(s) Signed: 03/22/2019 3:26:34 PM By:  Nicki Reaper, Dajea Entered By: Vernon Johnson on 03/22/2019 08:49:32 Vernon Johnson ([id number]) -------------------------------------------------------------------------------- Vitals Details Patient Name: Vernon Johnson, Vernon Johnson. Date of Service: 03/22/2019 8:45 AM Medical Record Number: [id number] Patient Account Number: [id number] Date of Birth/Sex: 02-Dec-1970 (47 y.o. M) Treating RN: Vernon Johnson Primary Care Crystalle Popwell: Vernon Johnson Other Clinician: Referring Kathlen Sakurai: Vernon Johnson Treating  Akeelah Seppala/Extender: Vernon Johnson Weeks in Treatment: 2 Vital Signs Time Taken: 08:36 Temperature (F): 99.1 Height (in): 71 Pulse (bpm): 106 Weight (lbs): 249 Respiratory Rate (breaths/min): 16 Body Mass Index (BMI): 34.7 Blood Pressure (mmHg): 127/75 Reference Range: 80 - 120 mg / dl Electronic Signature(s) Signed: 03/23/2019 3:15:39 PM By: Vernon Cool, BSN, RN, CWS, Kim RN, BSN Entered By: Vernon Johnson on 03/22/2019 08:37:29

## 2019-03-29 ENCOUNTER — Ambulatory Visit: Payer: 59 | Admitting: Physician Assistant

## 2019-08-19 ENCOUNTER — Emergency Department
Admission: EM | Admit: 2019-08-19 | Discharge: 2019-08-19 | Disposition: A | Discharge status: Home / Self Care | Payer: BC Managed Care – PPO | Attending: Emergency Medicine | Admitting: Emergency Medicine

## 2019-08-19 ENCOUNTER — Emergency Department: Payer: BC Managed Care – PPO

## 2019-08-19 ENCOUNTER — Other Ambulatory Visit: Payer: Self-pay

## 2019-08-19 DIAGNOSIS — Z794 Long term (current) use of insulin: Secondary | ICD-10-CM | POA: Diagnosis not present

## 2019-08-19 DIAGNOSIS — R0789 Other chest pain: Secondary | ICD-10-CM | POA: Diagnosis present

## 2019-08-19 DIAGNOSIS — Z7982 Long term (current) use of aspirin: Secondary | ICD-10-CM | POA: Insufficient documentation

## 2019-08-19 DIAGNOSIS — Z87891 Personal history of nicotine dependence: Secondary | ICD-10-CM | POA: Insufficient documentation

## 2019-08-19 DIAGNOSIS — E119 Type 2 diabetes mellitus without complications: Secondary | ICD-10-CM | POA: Diagnosis not present

## 2019-08-19 DIAGNOSIS — I10 Essential (primary) hypertension: Secondary | ICD-10-CM | POA: Insufficient documentation

## 2019-08-19 DIAGNOSIS — Z79899 Other long term (current) drug therapy: Secondary | ICD-10-CM | POA: Insufficient documentation

## 2019-08-19 DIAGNOSIS — R079 Chest pain, unspecified: Secondary | ICD-10-CM

## 2019-08-19 HISTORY — DX: Essential (primary) hypertension: I10

## 2019-08-19 LAB — BASIC METABOLIC PANEL
Anion gap: 11 (ref 5–15)
BUN: 13 mg/dL (ref 6–20)
CO2: 22 mmol/L (ref 22–32)
Calcium: 8.9 mg/dL (ref 8.9–10.3)
Chloride: 99 mmol/L (ref 98–111)
Creatinine, Ser: 1.03 mg/dL (ref 0.61–1.24)
GFR calc Af Amer: >60 mL/min (ref >60)
GFR calc non Af Amer: >60 mL/min (ref >60)
Glucose, Bld: 367 mg/dL — ABNORMAL HIGH (ref 70–99)
Potassium: 4 mmol/L (ref 3.5–5.1)
Sodium: 132 mmol/L — ABNORMAL LOW (ref 135–145)

## 2019-08-19 LAB — CBC
HCT: 46.6 % (ref 39.0–52.0)
Hemoglobin: 15.5 g/dL (ref 13.0–17.0)
MCH: 28.9 pg (ref 26.0–34.0)
MCHC: 33.3 g/dL (ref 30.0–36.0)
MCV: 86.8 fL (ref 80.0–100.0)
Platelets: 292 10*3/uL (ref 150–400)
RBC: 5.37 MIL/uL (ref 4.22–5.81)
RDW: 12.5 % (ref 11.5–15.5)
WBC: 7.5 10*3/uL (ref 4.0–10.5)
nRBC: 0 % (ref 0.0–0.2)

## 2019-08-19 LAB — TROPONIN I (HIGH SENSITIVITY)
Troponin I (High Sensitivity): 4 ng/L (ref <18)
Troponin I (High Sensitivity): 6 ng/L (ref <18)

## 2019-08-19 MED ORDER — IOHEXOL 350 MG/ML SOLN
75.0000 mL | Freq: Once | INTRAVENOUS | Status: AC | PRN
  Administered 2019-08-19: 14:00:00 75 mL via INTRAVENOUS

## 2019-08-19 MED ORDER — SODIUM CHLORIDE 0.9% FLUSH: 3.0000 mL | Freq: Once | INTRAVENOUS | Status: DC

## 2019-08-19 NOTE — ED Triage Notes (Signed)
Pt c/o sudden onset substernal chest pain that radiates into the back while driving about [id number] pTA. States he had some SOB this morning, states now it hurts to take a deep breath. Pt is in NAD at present.

## 2019-08-19 NOTE — ED Provider Notes (Signed)
[facility] Emergency Department Provider Note  Time seen: 1:18 PM  I have reviewed the triage vital signs and the nursing notes.   HISTORY  Chief Complaint Chest Pain   HPI Vernon Johnson is a 49 y.o. male with a past medical history of back pain, diabetes, hypertension, hyperlipidemia, presents to the emergency department for chest pain.  According to the patient  over the past several hours she has developed left-sided chest pain worse when he takes a deep inspiration as well as some mild shortness of breath.  States since this morning he has had a occasional dry cough.  Denies any nausea vomiting or abdominal pain.  No history of blood clots.  No hormonal treatments.  Past Medical History:  Diagnosis Date  . Acquired pes planus   . Back pain   . Carpal tunnel syndrome   . Diabetes mellitus without complication ([place])   . Diverticulitis   . Fatty liver   . Ganglion cyst   . High cholesterol   . HLD (hyperlipidemia)   . Hypertension   . Obesity   . Plantar fasciitis   . Sleep apnea   . Tachycardia     There are no problems to display for this patient.   Past Surgical History:  Procedure Laterality Date  . COLONOSCOPY WITH PROPOFOL N/A 01/06/2017   Procedure: COLONOSCOPY WITH PROPOFOL;  Surgeon: Lollie Sails, MD;  Location: [facility] ENDOSCOPY;  Service: Endoscopy;  Laterality: N/A;    Prior to Admission medications   Medication Sig Start Date End Date Taking? Authorizing Provider  aspirin 81 MG chewable tablet Chew 81 mg by mouth daily.    [provider]  glipiZIDE (GLUCOTROL) 10 MG tablet Take 20 mg by mouth 2 (two) times daily before a meal.     [provider]  glucose blood test strip 1 each by Other route as needed for other. Use as instructed    [provider]  HYDROcodone-acetaminophen (NORCO) 5-325 MG tablet Take 1-2 tablets by mouth every 6 (six) hours as needed. 01/04/18   Veryl Speak, MD  insulin  glargine (LANTUS) 100 UNIT/ML injection Inject 50 Units into the skin at bedtime.     [provider]  Melatonin Gummies 2.5 MG CHEW Chew 1 tablet by mouth at bedtime as needed (sleep).    [provider]  mupirocin ointment (BACTROBAN) 2 % Apply two times a day for 7 days. 02/25/19   Marylene Land, NP  pantoprazole (PROTONIX) 40 MG tablet Take 1 tablet (40 mg total) by mouth daily. 09/12/17 09/12/18  Harvest Dark, MD  sitaGLIPtin-metformin (JANUMET) 50-1000 MG tablet Take 1 tablet by mouth 2 (two) times daily with a meal.    [provider]    No Known Allergies  Family History  Problem Relation Age of Onset  . Diabetes Mother     Social History Social History   Tobacco Use  . Smoking status: Former Smoker    Types: Cigarettes    Quit date: 05/11/2015    Years since quitting: 4.2  . Smokeless tobacco: Current User    Types: Chew  Substance Use Topics  . Alcohol use: No  . Drug use: No    Review of Systems Constitutional: Negative for fever. Cardiovascular: Left-sided chest pain worse with deep inspiration. Respiratory: Mild shortness of breath.  Occasional dry cough. Gastrointestinal: Negative for abdominal pain, vomiting  Musculoskeletal: Negative for musculoskeletal complaints Neurological: Negative for headache All other ROS negative  ____________________________________________  PHYSICAL EXAM:  VITAL SIGNS: ED Triage Vitals  Enc Vitals Group     BP 08/19/19 1030 134/75     Pulse Rate 08/19/19 1030 (!) 101     Resp 08/19/19 1030 16     Temp 08/19/19 1030 98.7 F (37.1 C)     Temp Source 08/19/19 1030 Oral     SpO2 08/19/19 1030 99 %     Weight 08/19/19 1035 240 lb (108.9 kg)     Height 08/19/19 1035 5\' 10"  (1.778 m)     Head Circumference --      Peak Flow --      Pain Score 08/19/19 1035 6     Pain Loc --      Pain Edu? --      Excl. in [place]? --    Constitutional: Alert and oriented. Well appearing and in no  distress. Eyes: Normal exam ENT      Head: Normocephalic and atraumatic.      Mouth/Throat: Mucous membranes are moist. Cardiovascular: Normal rate, regular rhythm.  Chest wall is nontender to palpation. Respiratory: Normal respiratory effort without tachypnea nor retractions. Breath sounds are clear and equal  Gastrointestinal: Soft and nontender. No distention.   Musculoskeletal: Nontender with normal range of motion in all extremities. No lower extremity tenderness or edema.  Neurologic:  Normal speech and language. No gross focal neurologic deficits  Skin:  Skin is warm, dry and intact.  Psychiatric: Mood and affect are normal.   ____________________________________________    EKG  EKG viewed and interpreted by myself shows sinus tachycardia 104 bpm with a narrow QRS, normal axis, normal intervals, no concerning ST changes.  ____________________________________________    RADIOLOGY  Chest x-ray shows low lung volumes versus developing infiltrate  ____________________________________________   INITIAL IMPRESSION / ASSESSMENT AND PLAN / ED COURSE  Pertinent labs & imaging results that were available during my care of the patient were reviewed by me and considered in my medical decision making (see chart for details).   Patient presents emergency department for left-sided chest pain worse with deep inspiration.  States this started several hours ago.  Patient's labs are largely nonrevealing.  X-ray shows bibasilar atelectasis versus infiltrates.  Reassuringly patient's initial troponin is negative we will repeat a troponin to help rule out ACS.  Given the patient's pleuritic nature of his chest pain and borderline tachycardia we will proceed with a CTA of the chest to rule out PE.  Patient agreeable to plan of care.  Patient CT scan is negative.  Patient's repeat troponin largely unchanged.  Overall the patient appears well.  We will discharge the patient with PCP follow-up.  I  discussed return precautions with the patient.  He is agreeable to plan of care.  I also discussed follow-up with cardiology for stress test.  Vernon Johnson was evaluated in Emergency Department on 08/19/2019 for the symptoms described in the history of present illness. He was evaluated in the context of the global COVID-19 pandemic, which necessitated consideration that the patient might be at risk for infection with the SARS-CoV-2 virus that causes COVID-19. Institutional protocols and algorithms that pertain to the evaluation of patients at risk for COVID-19 are in a state of rapid change based on information released by regulatory bodies including the CDC and federal and state organizations. These policies and algorithms were followed during the patient's care in the ED.  ____________________________________________   FINAL CLINICAL IMPRESSION(S) / ED DIAGNOSES  Chest pain   Harvest Dark,  MD 08/19/19 1448

## 2019-08-19 NOTE — Discharge Instructions (Addendum)
Please call the number provided for cardiology to arrange a follow-up appointment for possible stress test.  Return to the emergency department for any return of/worsening chest pain shortness of breath, or any other symptom personally concerning to yourself.

## 2019-08-19 NOTE — ED Notes (Signed)
Light green and blue tubes sent to lab 

## 2019-08-19 NOTE — ED Notes (Signed)
Pt transported otf for CT 

## 2019-08-19 NOTE — ED Notes (Signed)
Pt laying in bed speaking with this RN in NAD. Pt reports central chest pain that radiated to left side of back this morning while driving. Pt reports the pain is mostly in the left side of his back now. Reports the last time he had this pain he was dx with "walking pneumonia". PT reports cough x 2-3 weeks, reports chills last night.

## 2019-08-31 ENCOUNTER — Ambulatory Visit: Payer: BC Managed Care – PPO | Attending: Internal Medicine

## 2019-08-31 DIAGNOSIS — Z23 Encounter for immunization: Secondary | ICD-10-CM

## 2019-08-31 NOTE — Progress Notes (Signed)
   Covid-19 Vaccination Clinic  Name:  DANTAVIOUS GOBLIRSCH    MRN: [id number] DOB: [birth year]  08/31/2019  Mr. Whidden was observed post Covid-19 immunization for 15 minutes without incident. He was provided with Vaccine Information Sheet and instruction to access the V-Safe system.   Mr. Hoglund was instructed to call 911 with any severe reactions post vaccine: Marland Kitchen Difficulty breathing  . Swelling of face and throat  . A fast heartbeat  . A bad rash all over body  . Dizziness and weakness   Immunizations Administered    Name Date Dose VIS Date Route   Moderna COVID-19 Vaccine 08/31/2019 12:36 PM 0.5 mL 04/2019 Intramuscular   Manufacturer: Moderna   Lot: [id number]   [place]:[id number]

## 2019-10-25 ENCOUNTER — Ambulatory Visit: Payer: Self-pay | Attending: Internal Medicine

## 2019-10-25 DIAGNOSIS — Z23 Encounter for immunization: Secondary | ICD-10-CM

## 2019-10-25 NOTE — Progress Notes (Signed)
   Covid-19 Vaccination Clinic  Name:  Vernon Johnson    MRN: [id number] DOB: 10/21/1970  10/25/2019  [name] was observed post Covid-19 immunization for 15 minutes without incident. He was provided with Vaccine Information Sheet and instruction to access the V-Safe system.   [name] was instructed to call 911 with any severe reactions post vaccine: Marland Kitchen Difficulty breathing  . Swelling of face and throat  . A fast heartbeat  . A bad rash all over body  . Dizziness and weakness   Immunizations Administered    Name Date Dose VIS Date Route   Moderna COVID-19 Vaccine 10/25/2019 12:45 PM 0.5 mL 04/2019 Intramuscular   Manufacturer: Moderna   Lot: [id number]   [place]: [id number]

## 2021-06-22 ENCOUNTER — Other Ambulatory Visit: Payer: Self-pay | Admitting: Nurse Practitioner

## 2021-06-22 ENCOUNTER — Other Ambulatory Visit (HOSPITAL_COMMUNITY): Payer: Self-pay | Admitting: Nurse Practitioner

## 2021-06-22 DIAGNOSIS — K760 Fatty (change of) liver, not elsewhere classified: Secondary | ICD-10-CM

## 2021-06-22 DIAGNOSIS — R748 Abnormal levels of other serum enzymes: Secondary | ICD-10-CM

## 2021-06-29 ENCOUNTER — Ambulatory Visit: Payer: BC Managed Care – PPO

## 2021-09-04 ENCOUNTER — Encounter: Payer: Self-pay | Admitting: *Deleted

## 2021-09-07 ENCOUNTER — Encounter: Payer: Self-pay | Admitting: *Deleted

## 2021-09-07 ENCOUNTER — Ambulatory Visit
Admission: RE | Admit: 2021-09-07 | Discharge: 2021-09-07 | Disposition: A | Discharge status: Home / Self Care | Payer: BC Managed Care – PPO | Attending: Gastroenterology | Admitting: Gastroenterology

## 2021-09-07 ENCOUNTER — Ambulatory Visit: Payer: BC Managed Care – PPO | Admitting: Certified Registered Nurse Anesthetist

## 2021-09-07 ENCOUNTER — Encounter
Admission: RE | Disposition: A | Discharge status: Home / Self Care | Payer: Self-pay | Source: Home / Self Care | Attending: Gastroenterology

## 2021-09-07 DIAGNOSIS — K640 First degree hemorrhoids: Secondary | ICD-10-CM | POA: Diagnosis not present

## 2021-09-07 DIAGNOSIS — E785 Hyperlipidemia, unspecified: Secondary | ICD-10-CM | POA: Diagnosis not present

## 2021-09-07 DIAGNOSIS — G4730 Sleep apnea, unspecified: Secondary | ICD-10-CM | POA: Diagnosis not present

## 2021-09-07 DIAGNOSIS — E7800 Pure hypercholesterolemia, unspecified: Secondary | ICD-10-CM | POA: Diagnosis not present

## 2021-09-07 DIAGNOSIS — K5730 Diverticulosis of large intestine without perforation or abscess without bleeding: Secondary | ICD-10-CM | POA: Diagnosis not present

## 2021-09-07 DIAGNOSIS — K760 Fatty (change of) liver, not elsewhere classified: Secondary | ICD-10-CM | POA: Diagnosis not present

## 2021-09-07 DIAGNOSIS — Z794 Long term (current) use of insulin: Secondary | ICD-10-CM | POA: Insufficient documentation

## 2021-09-07 DIAGNOSIS — Z7984 Long term (current) use of oral hypoglycemic drugs: Secondary | ICD-10-CM | POA: Insufficient documentation

## 2021-09-07 DIAGNOSIS — I10 Essential (primary) hypertension: Secondary | ICD-10-CM | POA: Insufficient documentation

## 2021-09-07 DIAGNOSIS — Z6835 Body mass index (BMI) 35.0-35.9, adult: Secondary | ICD-10-CM | POA: Diagnosis not present

## 2021-09-07 DIAGNOSIS — Z1211 Encounter for screening for malignant neoplasm of colon: Secondary | ICD-10-CM | POA: Diagnosis not present

## 2021-09-07 DIAGNOSIS — Z79899 Other long term (current) drug therapy: Secondary | ICD-10-CM | POA: Diagnosis not present

## 2021-09-07 DIAGNOSIS — Z87891 Personal history of nicotine dependence: Secondary | ICD-10-CM | POA: Diagnosis not present

## 2021-09-07 DIAGNOSIS — E119 Type 2 diabetes mellitus without complications: Secondary | ICD-10-CM | POA: Insufficient documentation

## 2021-09-07 DIAGNOSIS — E669 Obesity, unspecified: Secondary | ICD-10-CM | POA: Insufficient documentation

## 2021-09-07 DIAGNOSIS — Z86010 Personal history of colonic polyps: Secondary | ICD-10-CM | POA: Insufficient documentation

## 2021-09-07 HISTORY — PX: COLONOSCOPY WITH PROPOFOL: SHX5780

## 2021-09-07 LAB — GLUCOSE, CAPILLARY: Glucose-Capillary: 274 mg/dL — ABNORMAL HIGH (ref 70–99)

## 2021-09-07 SURGERY — COLONOSCOPY WITH PROPOFOL
Anesthesia: General

## 2021-09-07 MED ORDER — SODIUM CHLORIDE 0.9 % IV SOLN: INTRAVENOUS | Status: DC

## 2021-09-07 MED ORDER — PROPOFOL 10 MG/ML IV BOLUS
INTRAVENOUS | Status: DC | PRN
  Administered 2021-09-07 (×2): 20 mg via INTRAVENOUS
  Administered 2021-09-07: 60 mg via INTRAVENOUS

## 2021-09-07 MED ORDER — PROPOFOL 500 MG/50ML IV EMUL
INTRAVENOUS | Status: DC | PRN
  Administered 2021-09-07: 150 ug/kg/min via INTRAVENOUS

## 2021-09-07 MED ORDER — LIDOCAINE HCL (CARDIAC) PF 100 MG/5ML IV SOSY
PREFILLED_SYRINGE | INTRAVENOUS | Status: DC | PRN
  Administered 2021-09-07: 50 mg via INTRAVENOUS

## 2021-09-07 MED ORDER — LIDOCAINE HCL (PF) 2 % IJ SOLN
INTRAMUSCULAR | Status: AC
  Filled 2021-09-07: qty 5

## 2021-09-07 MED ORDER — PROPOFOL 10 MG/ML IV BOLUS
INTRAVENOUS | Status: AC
  Filled 2021-09-07: qty 20

## 2021-09-07 NOTE — Anesthesia Preprocedure Evaluation (Signed)
Anesthesia Evaluation  ?Patient identified by MRN, date of birth, ID band ?Patient awake ? ? ? ?Reviewed: ?Allergy & Precautions, NPO status , Patient's Chart, lab work & pertinent test results ? ?History of Anesthesia Complications ?Negative for: history of anesthetic complications ? ?Airway ?Mallampati: II ? ?TM Distance: >3 FB ?Neck ROM: Full ? ? ? Dental ?no notable dental hx. ?(+) Teeth Intact ?  ?Pulmonary ?sleep apnea and Continuous Positive Airway Pressure Ventilation , neg COPD, Patient abstained from smoking.Not current smoker, former smoker,  ?  ?Pulmonary exam normal ?breath sounds clear to auscultation ? ? ? ? ? ? Cardiovascular ?Exercise Tolerance: Good ?METS(-) hypertension(-) CAD and (-) Past MI negative cardio ROS ? ?(-) dysrhythmias  ?Rhythm:Regular Rate:Normal ?- Systolic murmurs ? ?  ?Neuro/Psych ?negative neurological ROS ? negative psych ROS  ? GI/Hepatic ?negative GI ROS, Neg liver ROS, neg GERD  ,  ?Endo/Other  ?diabetes, Poorly Controlled, Type 1, Insulin Dependent ? Renal/GU ?negative Renal ROS  ? ?  ?Musculoskeletal ?negative musculoskeletal ROS ?(+)  ? Abdominal ?(+) + obese,   ?Peds ?negative pediatric ROS ?(+)  Hematology ?  ?Anesthesia Other Findings ?Past Medical History: ?No date: Acquired pes planus ?No date: Back pain ?No date: Carpal tunnel syndrome ?No date: Diabetes mellitus without complication ([place]) ?No date: Diverticulitis ?No date: Fatty liver ?No date: Ganglion cyst ?No date: High cholesterol ?No date: HLD (hyperlipidemia) ?No date: Obesity ?No date: Plantar fasciitis ?No date: Sleep apnea ?No date: Tachycardia ? Reproductive/Obstetrics ? ?  ? ? ? ? ? ? ? ? ? ? ? ? ? ?  ?  ? ? ? ? ? ? ? ? ?Anesthesia Physical ? ?Anesthesia Plan ? ?ASA: 3 ? ?Anesthesia Plan: General  ? ?Post-op Pain Management: Minimal or no pain anticipated  ? ?Induction: Intravenous ? ?PONV Risk Score and Plan: 2 and Propofol infusion, TIVA and Ondansetron ? ?Airway  Management Planned: Natural Airway and Nasal Cannula ? ?Additional Equipment: None ? ?Intra-op Plan:  ? ?Post-operative Plan:  ? ?Informed Consent: I have reviewed the patients History and Physical, chart, labs and discussed the procedure including the risks, benefits and alternatives for the proposed anesthesia with the patient or authorized representative who has indicated his/her understanding and acceptance.  ? ? ? ?Dental advisory given ? ?Plan Discussed with: Surgeon ? ?Anesthesia Plan Comments: (Discussed risks of anesthesia with patient, including possibility of difficulty with spontaneous ventilation under anesthesia necessitating airway intervention, PONV, and rare risks such as cardiac or respiratory or neurological events, and allergic reactions. Discussed the role of CRNA in patient's perioperative care. Patient understands.)  ? ? ? ? ? ? ?Anesthesia Quick Evaluation ? ?

## 2021-09-07 NOTE — Transfer of Care (Signed)
Immediate Anesthesia Transfer of Care Note ? ?Patient: Vernon Johnson ? ?Procedure(s) Performed: COLONOSCOPY WITH PROPOFOL ? ?Patient Location: Endoscopy Unit ? ?Anesthesia Type:General ? ?Level of Consciousness: drowsy ? ?Airway & Oxygen Therapy: Patient Spontanous Breathing ? ?Post-op Assessment: Report given to RN and Post -op Vital signs reviewed and stable ? ?Post vital signs: Reviewed and stable ? ?Last Vitals:  ?Vitals Value Taken Time  ?BP    ?Temp    ?Pulse    ?Resp    ?SpO2    ? ? ?Last Pain:  ?Vitals:  ? 09/07/21 0843  ?TempSrc: Tympanic  ?PainSc: Asleep  ?   ? ?  ? ?Complications: No notable events documented. ?

## 2021-09-07 NOTE — Interval H&P Note (Signed)
History and Physical Interval Note: ? ?09/07/2021 ?8:17 AM ? ?Vernon Johnson  has presented today for surgery, with the diagnosis of H/O Colon Polyps.  The various methods of treatment have been discussed with the patient and family. After consideration of risks, benefits and other options for treatment, the patient has consented to  Procedure(s) with comments: ?COLONOSCOPY WITH PROPOFOL (N/A) - IDDM as a surgical intervention.  The patient's history has been reviewed, patient examined, no change in status, stable for surgery.  I have reviewed the patient's chart and labs.  Questions were answered to the patient's satisfaction.   ? ? ?[name] ? ?Ok to proceed with colonoscopy ?

## 2021-09-07 NOTE — Anesthesia Postprocedure Evaluation (Signed)
Anesthesia Post Note ? ?Patient: Vernon Johnson ? ?Procedure(s) Performed: COLONOSCOPY WITH PROPOFOL ? ?Patient location during evaluation: Endoscopy ?Anesthesia Type: General ?Level of consciousness: awake and alert ?Pain management: pain level controlled ?Vital Signs Assessment: post-procedure vital signs reviewed and stable ?Respiratory status: spontaneous breathing, nonlabored ventilation, respiratory function stable and patient connected to nasal cannula oxygen ?Cardiovascular status: blood pressure returned to baseline and stable ?Postop Assessment: no apparent nausea or vomiting ?Anesthetic complications: no ? ? ?No notable events documented. ? ? ?Last Vitals:  ?Vitals:  ? 09/07/21 0843 09/07/21 0853  ?BP: 123/75 (!) 142/99  ?Pulse: 94 96  ?Resp: 18 19  ?Temp: (!) 35.8 ?C   ?SpO2: 97% 99%  ?  ?Last Pain:  ?Vitals:  ? 09/07/21 0853  ?TempSrc:   ?PainSc: 0-No pain  ? ? ?  ?  ?  ?  ?  ?  ? ?Arita Miss ? ? ? ? ?

## 2021-09-07 NOTE — Op Note (Signed)
[facility] ?Gastroenterology ?Patient Name: Vernon Johnson ?Procedure Date: 09/07/2021 8:15 AM ?MRN: [id number] ?Account #: [id number] ?Date of Birth: [birth year] ?Admit Type: Outpatient ?Age: 51 ?Room: [facility] ENDO ROOM 3 ?Gender: Male ?Note Status: Finalized ?Instrument Name: Colonoscope [id number] ?Procedure:             Colonoscopy ?Indications:           Surveillance: Personal history of adenomatous polyps  ?                       on last colonoscopy 5 years ago ?Providers:             Andrey Farmer MD, MD ?Referring MD:          Rubbie Battiest. Iona Beard MD, MD (Referring MD) ?Medicines:             Monitored Anesthesia Care ?Complications:         No immediate complications. Estimated blood loss:  ?                       Minimal. ?Procedure:             Pre-Anesthesia Assessment: ?                       - Prior to the procedure, a History and Physical was  ?                       performed, and patient medications and allergies were  ?                       reviewed. The patient is competent. The risks and  ?                       benefits of the procedure and the sedation options and  ?                       risks were discussed with the patient. All questions  ?                       were answered and informed consent was obtained.  ?                       Patient identification and proposed procedure were  ?                       verified by the physician, the nurse, the  ?                       anesthesiologist, the anesthetist and the technician  ?                       in the endoscopy suite. Mental Status Examination:  ?                       alert and oriented. Airway Examination: normal  ?                       oropharyngeal airway and neck mobility. Respiratory  ?  Examination: clear to auscultation. CV Examination:  ?                       normal. Prophylactic Antibiotics: The patient does not  ?                       require prophylactic antibiotics. Prior  ?                        Anticoagulants: The patient has taken no previous  ?                       anticoagulant or antiplatelet agents. ASA Grade  ?                       Assessment: II - A patient with mild systemic disease.  ?                       After reviewing the risks and benefits, the patient  ?                       was deemed in satisfactory condition to undergo the  ?                       procedure. The anesthesia plan was to use monitored  ?                       anesthesia care (MAC). Immediately prior to  ?                       administration of medications, the patient was  ?                       re-assessed for adequacy to receive sedatives. The  ?                       heart rate, respiratory rate, oxygen saturations,  ?                       blood pressure, adequacy of pulmonary ventilation, and  ?                       response to care were monitored throughout the  ?                       procedure. The physical status of the patient was  ?                       re-assessed after the procedure. ?                       After obtaining informed consent, the colonoscope was  ?                       passed under direct vision. Throughout the procedure,  ?                       the patient's blood pressure, pulse, and oxygen  ?  saturations were monitored continuously. The  ?                       Colonoscope was introduced through the anus and  ?                       advanced to the the ileocecal valve. The colonoscopy  ?                       was technically difficult and complex due to  ?                       inadequate bowel prep. The patient tolerated the  ?                       procedure well. The quality of the bowel preparation  ?                       was inadequate. ?Findings: ?     The perianal and digital rectal examinations were normal. ?     A few small-mouthed diverticula were found in the sigmoid colon and  ?     descending colon. ?     Internal hemorrhoids were found  during retroflexion. The hemorrhoids  ?     were Grade I (internal hemorrhoids that do not prolapse). ?     The exam was otherwise without abnormality on direct and retroflexion  ?     views. ?Impression:            - Preparation of the colon was inadequate. ?                       - Diverticulosis in the sigmoid colon and in the  ?                       descending colon. ?                       - Internal hemorrhoids. ?                       - The examination was otherwise normal on direct and  ?                       retroflexion views. ?                       - No specimens collected. ?Recommendation:        - Discharge patient to home. ?                       - Resume previous diet. ?                       - Continue present medications. ?                       - Repeat colonoscopy at the next available appointment  ?                       because the bowel preparation was suboptimal. ?                       -  Return to referring physician as previously  ?                       scheduled. ?Procedure Code(s):     --- Professional --- ?                       G0105, Colorectal cancer screening; colonoscopy on  ?                       individual at high risk ?Diagnosis Code(s):     --- Professional --- ?                       Z86.010, Personal history of colonic polyps ?                       K64.0, First degree hemorrhoids ?                       K57.30, Diverticulosis of large intestine without  ?                       perforation or abscess without bleeding ?CPT copyright 2019 American Medical Association. All rights reserved. ?The codes documented in this report are preliminary and upon coder review may  ?be revised to meet current compliance requirements. ?Andrey Farmer MD, MD ?09/07/2021 8:43:30 AM ?Number of Addenda: 0 ?Note Initiated On: 09/07/2021 8:15 AM ?Total Procedure Duration: 0 hours 14 minutes 50 seconds  ?Estimated Blood Loss:  Estimated blood loss was minimal. ?     [facility] ?

## 2021-09-07 NOTE — H&P (Signed)
Outpatient short stay form Pre-procedure ?09/07/2021  ?Vernon Rubenstein, MD ? ?Primary Physician: Sharyne Peach, MD ? ?Reason for visit:  Surveillance ? ?History of present illness:   ? ?51 y/o gentleman with history of DM II, hypertension, and obesity here for surveillance colonoscopy. Had several adenomatous polyps on his last colonoscopy in 2018 with a 12 mm polyp removed piecemeal at the hepatic flexture. No family history of GI malignancies. No blood thinners. No significant abdominal surgeries. ? ? ? ?Current Facility-Administered Medications:  ?  0.9 %  sodium chloride infusion, , Intravenous, Continuous, Jolayne Branson, Hilton Cork, MD ? ?Medications Prior to Admission  ?Medication Sig Dispense Refill Last Dose  ? baclofen (LIORESAL) 10 MG tablet Take 10 mg by mouth at bedtime as needed for muscle spasms.     ? cetirizine (ZYRTEC) 10 MG chewable tablet Chew 10 mg by mouth daily.     ? gabapentin (NEURONTIN) 300 MG capsule Take 300 mg by mouth 2 (two) times daily.   Past Week  ? glipiZIDE (GLUCOTROL) 10 MG tablet Take 20 mg by mouth 2 (two) times daily before a meal.    Past Week  ? insulin aspart (NOVOLOG) 100 UNIT/ML injection Inject 36 Units into the skin every morning. 35 units with LUNCH ?36 units with DINNER   Past Week  ? insulin glargine (LANTUS) 100 UNIT/ML injection Inject 80 Units into the skin at bedtime.   Past Week  ? lidocaine (LIDODERM) 5 % Place 1 patch onto the skin daily. Remove & Discard patch within 12 hours or as directed by MD     ? lisinopril (ZESTRIL) 5 MG tablet Take 5 mg by mouth daily.     ? naproxen (NAPROSYN) 500 MG tablet Take 500 mg by mouth 2 (two) times daily with a meal.   Past Week  ? pravastatin (PRAVACHOL) 10 MG tablet Take 10 mg by mouth daily.   Past Week  ? sitaGLIPtin-metformin (JANUMET) 50-1000 MG tablet Take 1 tablet by mouth 2 (two) times daily with a meal.   Past Week  ? aspirin 81 MG chewable tablet Chew 81 mg by mouth daily. (Patient not taking: Reported on 09/07/2021)    Not Taking  ? glucose blood test strip 1 each by Other route as needed for other. Use as instructed     ? HYDROcodone-acetaminophen (NORCO) 5-325 MG tablet Take 1-2 tablets by mouth every 6 (six) hours as needed. (Patient not taking: Reported on 09/07/2021) 20 tablet 0 Completed Course  ? Melatonin Gummies 2.5 MG CHEW Chew 1 tablet by mouth at bedtime as needed (sleep).     ? mupirocin ointment (BACTROBAN) 2 % Apply two times a day for 7 days. 22 g 0   ? pantoprazole (PROTONIX) 40 MG tablet Take 1 tablet (40 mg total) by mouth daily. 30 tablet 1   ? ? ? ?Allergies  ?Allergen Reactions  ? Atorvastatin Other (See Comments)  ?  Muscle Pain  ? ? ? ?Past Medical History:  ?Diagnosis Date  ? Acquired pes planus   ? Back pain   ? Carpal tunnel syndrome   ? Diabetes mellitus without complication ([place])   ? Diverticulitis   ? Fatty liver   ? Ganglion cyst   ? High cholesterol   ? HLD (hyperlipidemia)   ? Obesity   ? Plantar fasciitis   ? Sleep apnea   ? Tachycardia   ? ? ?Review of systems:  Otherwise negative.  ? ? ?Physical Exam ? ?Gen: Alert, oriented.  Appears stated age.  ?HEENT: PERRLA. ?Lungs: No respiratory distress ?CV: RRR ?Abd: soft, benign, no masses ?Ext: No edema ? ? ? ?Planned procedures: Proceed with colonoscopy. The patient understands the nature of the planned procedure, indications, risks, alternatives and potential complications including but not limited to bleeding, infection, perforation, damage to internal organs and possible oversedation/side effects from anesthesia. The patient agrees and gives consent to proceed.  ?Please refer to procedure notes for findings, recommendations and patient disposition/instructions.  ? ? ? ?Vernon Rubenstein, MD ?[facility] ? ? ? ?  ? ?

## 2021-09-07 NOTE — Anesthesia Procedure Notes (Signed)
Procedure Name: [place] ?Date/Time: 09/07/2021 8:15 AM ?Performed by: Tollie Eth, CRNA ?Pre-anesthesia Checklist: Patient identified, Emergency Drugs available, Suction available and Patient being monitored ?Patient Re-evaluated:Patient Re-evaluated prior to induction ?Oxygen Delivery Method: Simple face mask ?Induction Type: IV induction ?Placement Confirmation: positive ETCO2 ? ? ? ? ?

## 2021-09-08 ENCOUNTER — Encounter: Payer: Self-pay | Admitting: Gastroenterology

## 2021-10-08 IMAGING — CR DG TIBIA/FIBULA 2V*R*
2 series · 3 of 3 positions shown · non-contrast
Comparison: None.

CLINICAL DATA: Pain after trauma

EXAM:
RIGHT TIBIA AND FIBULA - 2 VIEW

[Series 1: tibia ap · 0.14mm/px · 2 of 2 slices shown]
[im 1/2]
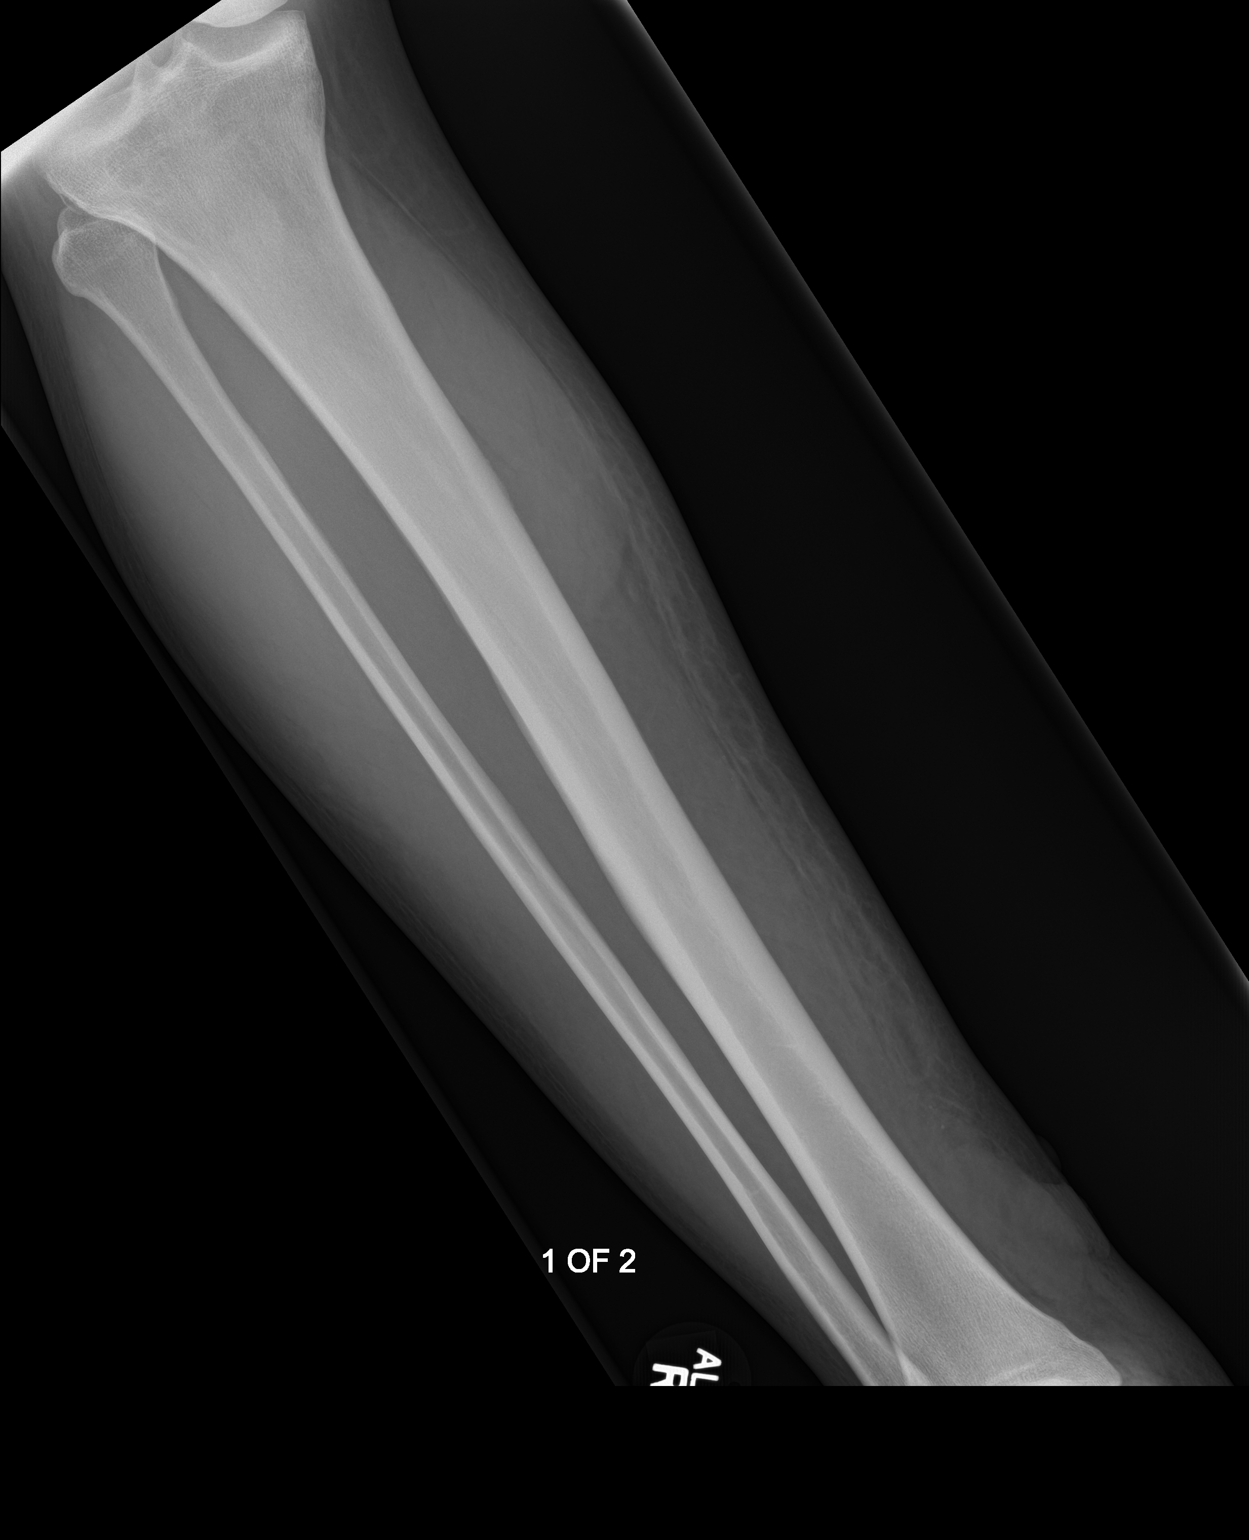
[im 2/2]
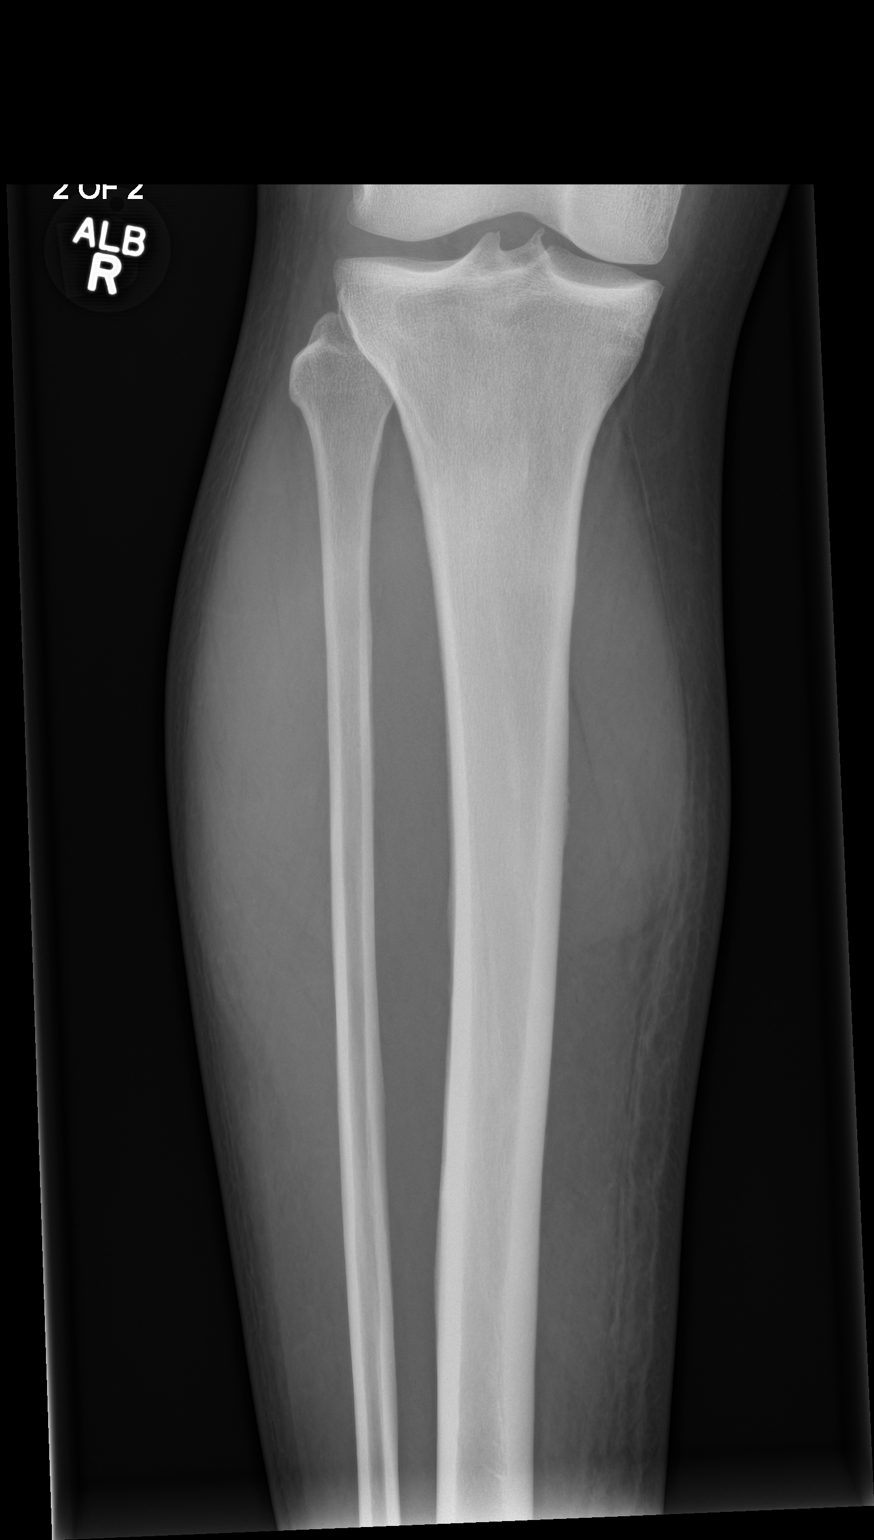

[tibia lat]
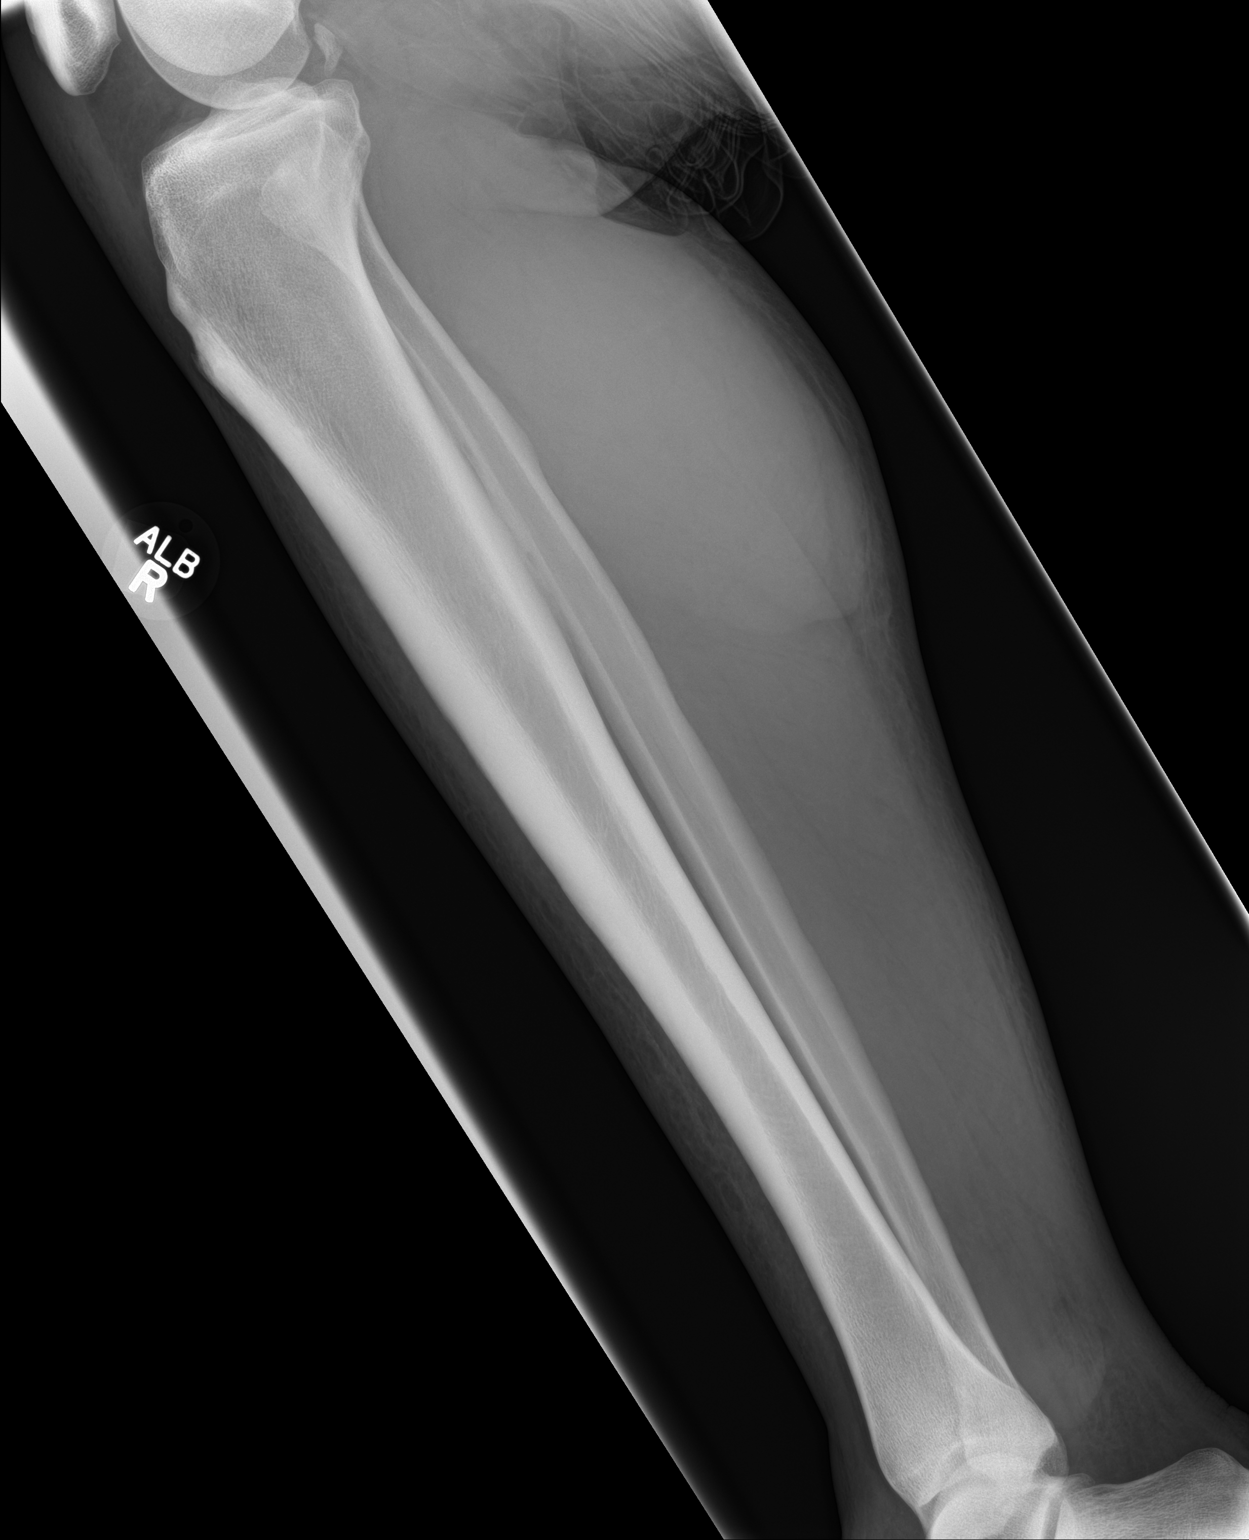

[3 of 3 positions shown; findings below may reference images not displayed]

FINDINGS: Soft tissue edema. Too high attenuation foci are seen in the medial
tissues of the distal lower leg.
IMPRESSION: 1. No fractures.
2. Edema in the medial aspect of the lower leg.
3. 2 adjacent high attenuation foci in the medial soft tissues of
the left lower leg could represent soft tissue calcifications or
foreign bodies. Recommend clinical correlation.

## 2021-11-23 ENCOUNTER — Other Ambulatory Visit: Payer: Self-pay

## 2021-11-23 ENCOUNTER — Emergency Department (HOSPITAL_COMMUNITY)
Admission: EM | Admit: 2021-11-23 | Discharge: 2021-11-23 | Disposition: A | Discharge status: Home / Self Care | Payer: BC Managed Care – PPO | Attending: Emergency Medicine | Admitting: Emergency Medicine

## 2021-11-23 ENCOUNTER — Emergency Department (HOSPITAL_COMMUNITY): Payer: BC Managed Care – PPO

## 2021-11-23 ENCOUNTER — Encounter (HOSPITAL_COMMUNITY): Payer: Self-pay | Admitting: Emergency Medicine

## 2021-11-23 ENCOUNTER — Emergency Department (HOSPITAL_BASED_OUTPATIENT_CLINIC_OR_DEPARTMENT_OTHER): Payer: BC Managed Care – PPO

## 2021-11-23 DIAGNOSIS — R079 Chest pain, unspecified: Secondary | ICD-10-CM | POA: Insufficient documentation

## 2021-11-23 DIAGNOSIS — M79609 Pain in unspecified limb: Secondary | ICD-10-CM | POA: Diagnosis not present

## 2021-11-23 DIAGNOSIS — Z5321 Procedure and treatment not carried out due to patient leaving prior to being seen by health care provider: Secondary | ICD-10-CM | POA: Diagnosis not present

## 2021-11-23 DIAGNOSIS — M79651 Pain in right thigh: Secondary | ICD-10-CM

## 2021-11-23 LAB — CBC
HCT: 45.1 % (ref 39.0–52.0)
Hemoglobin: 15.3 g/dL (ref 13.0–17.0)
MCH: 29.3 pg (ref 26.0–34.0)
MCHC: 33.9 g/dL (ref 30.0–36.0)
MCV: 86.2 fL (ref 80.0–100.0)
Platelets: 272 10*3/uL (ref 150–400)
RBC: 5.23 MIL/uL (ref 4.22–5.81)
RDW: 12.6 % (ref 11.5–15.5)
WBC: 7.3 10*3/uL (ref 4.0–10.5)
nRBC: 0 % (ref 0.0–0.2)

## 2021-11-23 LAB — BASIC METABOLIC PANEL
Anion gap: 11 (ref 5–15)
BUN: 15 mg/dL (ref 6–20)
CO2: 24 mmol/L (ref 22–32)
Calcium: 9.2 mg/dL (ref 8.9–10.3)
Chloride: 100 mmol/L (ref 98–111)
Creatinine, Ser: 0.92 mg/dL (ref 0.61–1.24)
GFR, Estimated: >60 mL/min (ref >60)
Glucose, Bld: 339 mg/dL — ABNORMAL HIGH (ref 70–99)
Potassium: 4 mmol/L (ref 3.5–5.1)
Sodium: 135 mmol/L (ref 135–145)

## 2021-11-23 LAB — TROPONIN I (HIGH SENSITIVITY)
Troponin I (High Sensitivity): 4 ng/L (ref <18)
Troponin I (High Sensitivity): 4 ng/L (ref <18)

## 2021-11-23 NOTE — ED Provider Triage Note (Signed)
Emergency Medicine Provider Triage Evaluation Note  Vernon Johnson , a 51 y.o. male  was evaluated in triage.  Pt complains of chest pain.  Saturday left arm felt numb for about 24 hours and improved. Chest started hurting on Sunday around 5 pm. Mid chest. Does not radiate. Feels like he pulled something. Worse with movements. No lifting or exertion lately. Never had symptoms before.  Also describes right thigh pain a few days before. Concerned about blood clot. Denies shortness of breath, nausea, vomiting, dizziness, or weakness. No hx of blood clots.   Review of Systems  Positive: Chest pain Negative:    Physical Exam  BP 129/77   Pulse 96   Temp 98.1 F (36.7 C) (Oral)   Resp 16   SpO2 99%  Gen:   Awake, no distress   Resp:  Normal effort  MSK:   Moves extremities without difficulty  Other:    Medical Decision Making  Medically screening exam initiated at 7:09 AM.  Appropriate orders placed.  Vernon Johnson was informed that the remainder of the evaluation will be completed by another provider, this initial triage assessment does not replace that evaluation, and the importance of remaining in the ED until their evaluation is complete.     Adolphus Birchwood, [place] 11/23/21 [phone]

## 2021-11-23 NOTE — ED Notes (Signed)
Called patient several times for vital check

## 2021-11-23 NOTE — ED Notes (Signed)
No answer x3

## 2021-11-23 NOTE — Progress Notes (Signed)
VASCULAR LAB    Right lower extremity venous duplex has been performed.  See CV proc for preliminary results.   [place]mit [place], RVT 11/23/2021, 8:44 AM

## 2021-11-23 NOTE — ED Notes (Signed)
Nurse tech has called pt x3 for vital check with no response.

## 2021-11-23 NOTE — ED Triage Notes (Signed)
Patient reports intermittent central chest pain onset yesterday , denies SOB , no emesis or diaphoresis , pain increases with movement / changing positions .

## 2022-04-01 IMAGING — CR DG CHEST 2V
1 series · 2 of 2 positions shown · non-contrast
Comparison: 09/12/2017

CLINICAL DATA: Chest pain, shortness of breath

EXAM:
CHEST - 2 VIEW

[Series 1: dg chest 2 view · 0.14mm/px · 2 of 2 slices shown]
[im 1/2]
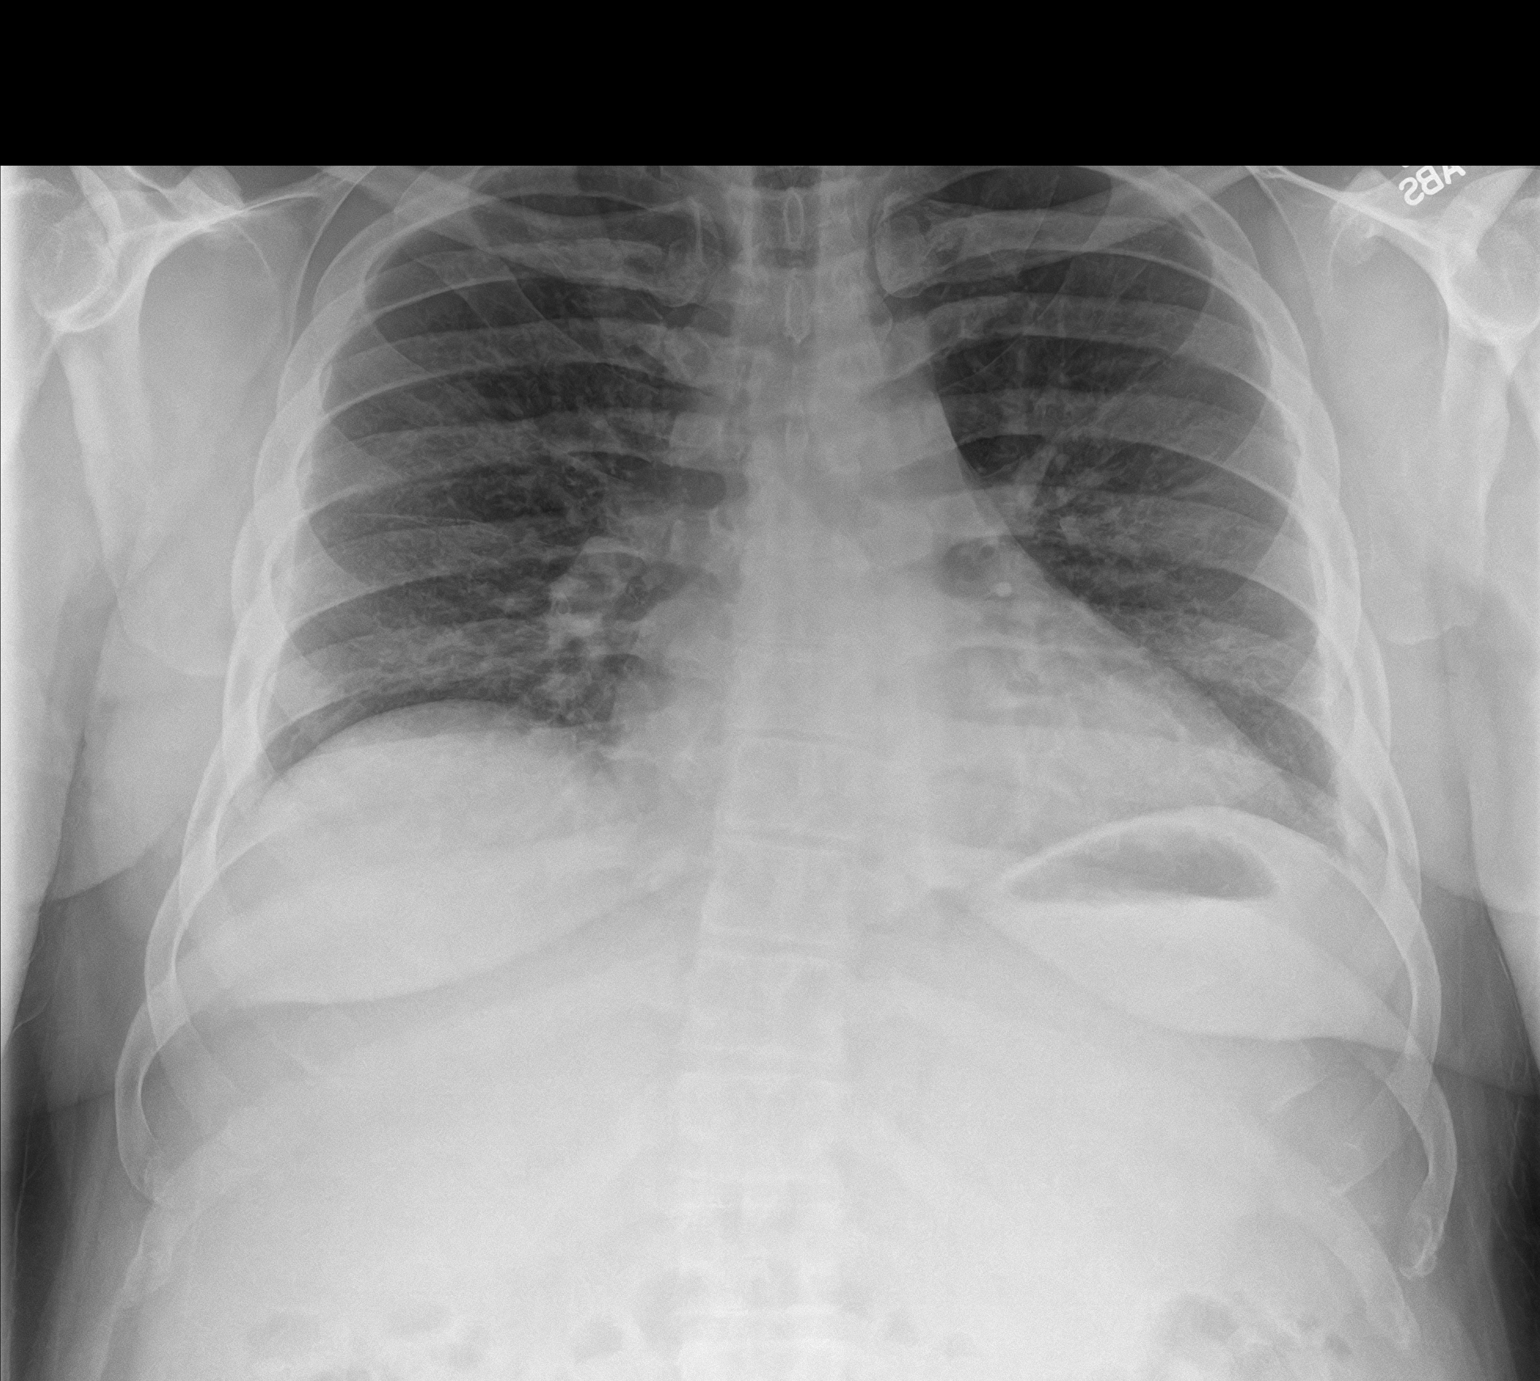
[im 2/2]
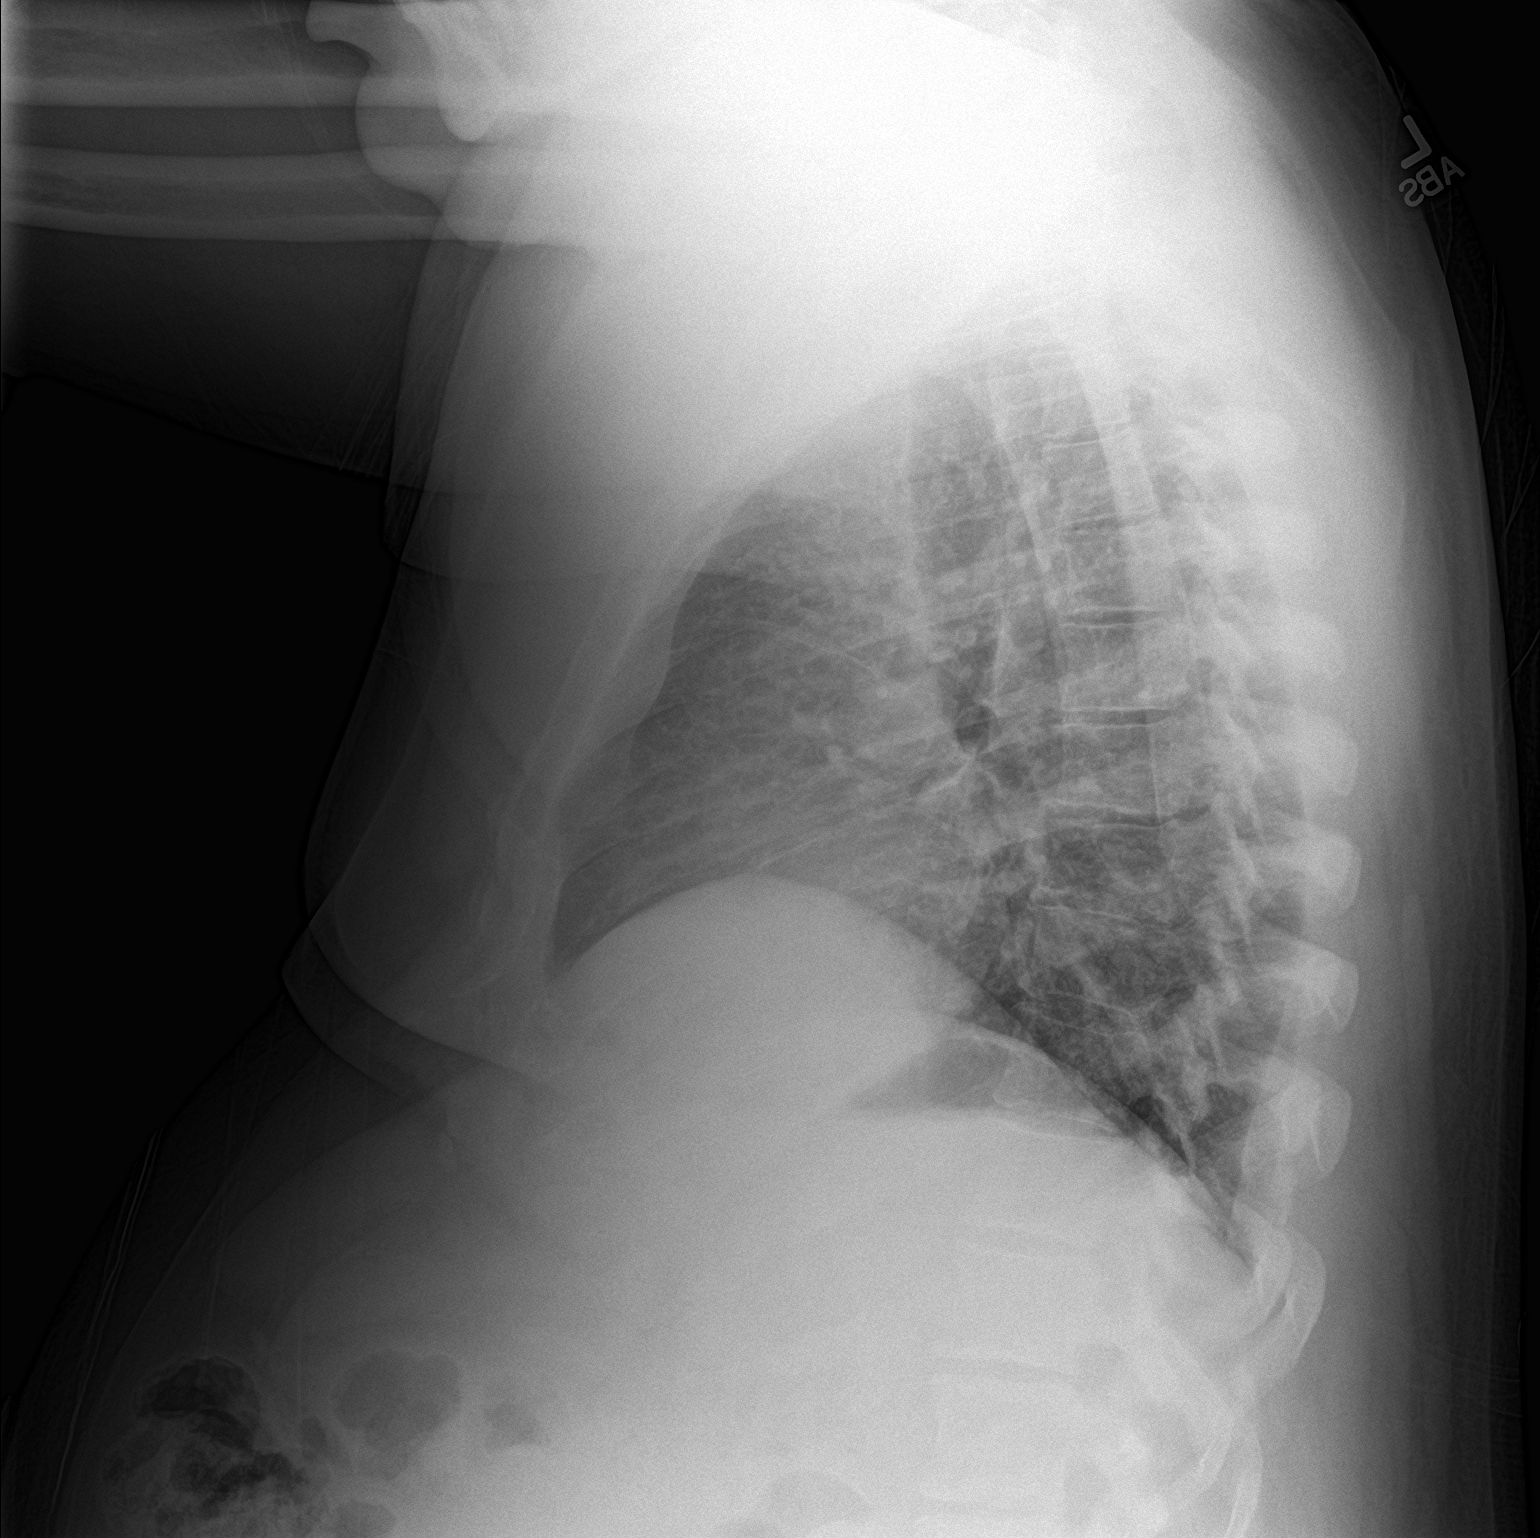

[2 of 2 positions shown; findings below may reference images not displayed]

FINDINGS: The heart size and mediastinal contours are stable. Mild bibasilar
interstitial prominence, which may be accentuated by low lung
volumes. No pleural effusion or pneumothorax.
IMPRESSION: Mild bibasilar interstitial prominence which may be accentuated by
low lung volumes versus developing infiltrates.

## 2022-04-01 IMAGING — CT CT ANGIO CHEST
2 of 6 series · 18 of 46 positions shown · IV contrast (APPLIED)
Comparison: 02/24/2005

CLINICAL DATA: Substernal chest pain radiating to back with some
shortness-of-breath since this morning.

EXAM:
CT ANGIOGRAPHY CHEST WITH CONTRAST
TECHNIQUE: Multidetector CT imaging of the chest was performed using the
standard protocol during bolus administration of intravenous
contrast. Multiplanar CT image reconstructions and MIPs were
obtained to evaluate the vascular anatomy.
CONTRAST:  75mL OMNIPAQUE IOHEXOL 350 MG/ML SOLN

[Series 5: thins · axial · 0.74mm/px · z∈[-106,+151]mm · 15 of 283 slices shown]
[im 13/283  lung]
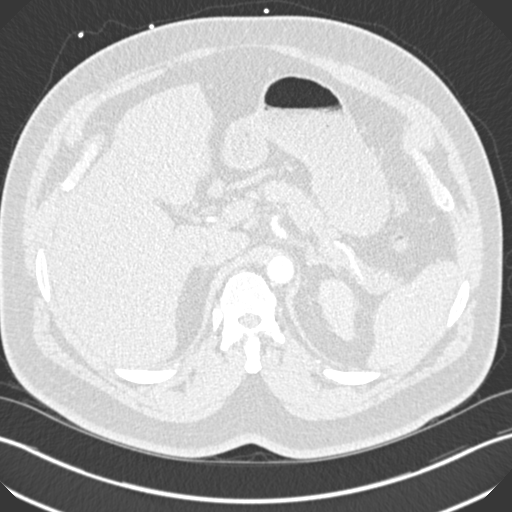
[im 37/283  soft-tissue]
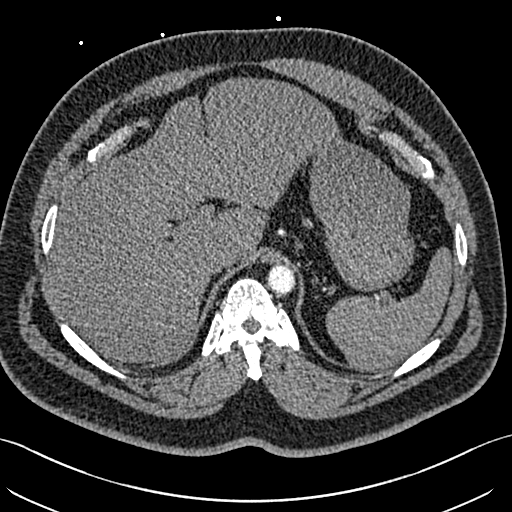
[im 50/283  lung]
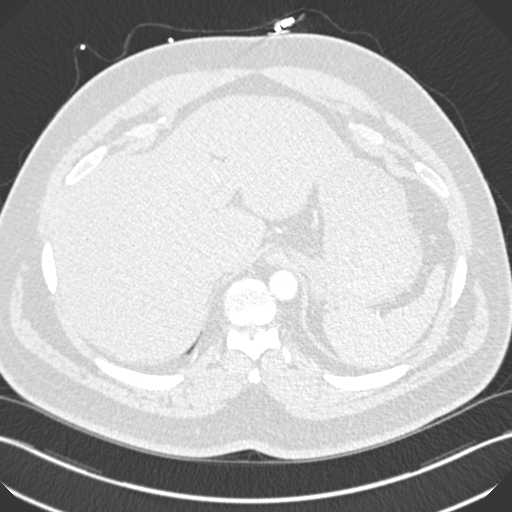
[im 74/283  soft-tissue]
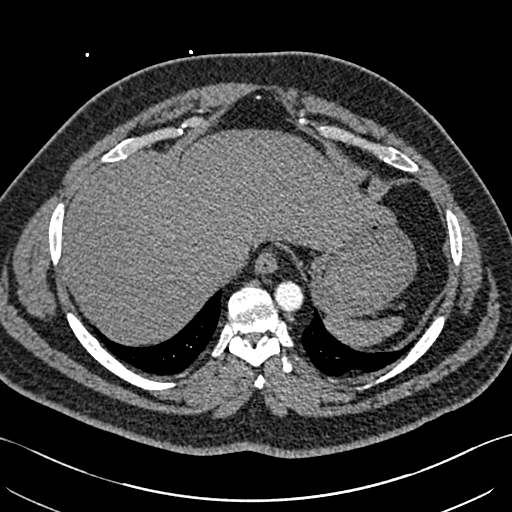
[im 86/283  lung]
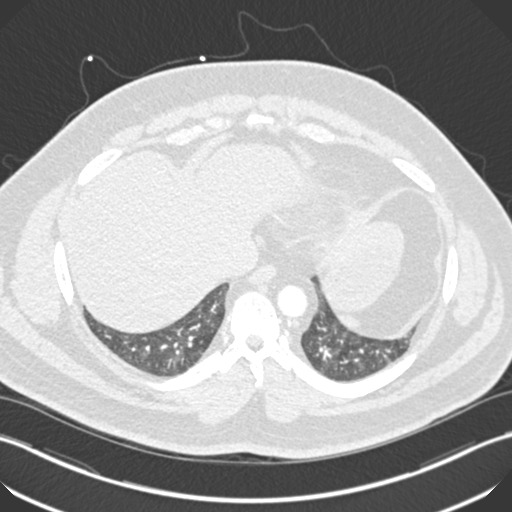
[im 111/283  soft-tissue]
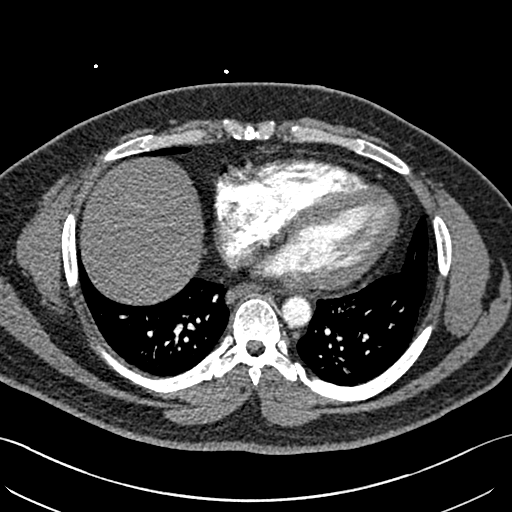
[im 123/283  lung]
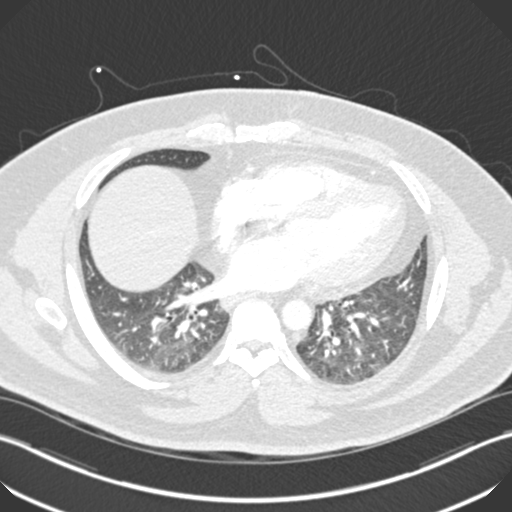
[im 148/283  soft-tissue]
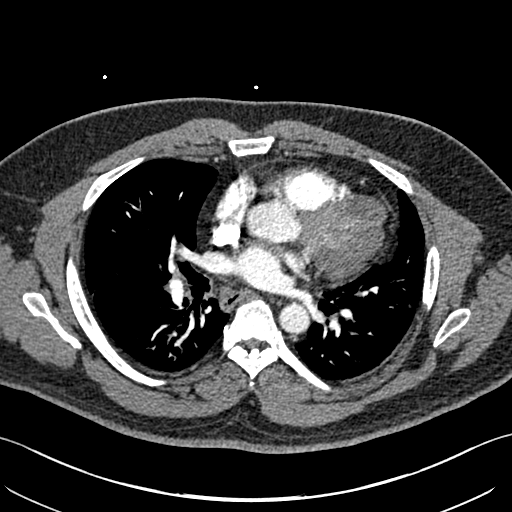
[im 160/283  lung]
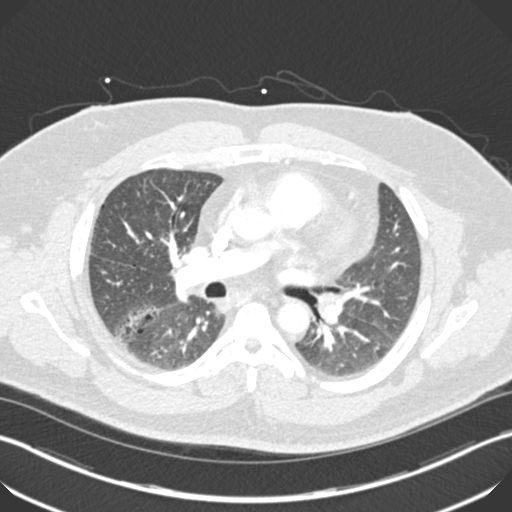
[im 172/283  soft-tissue]
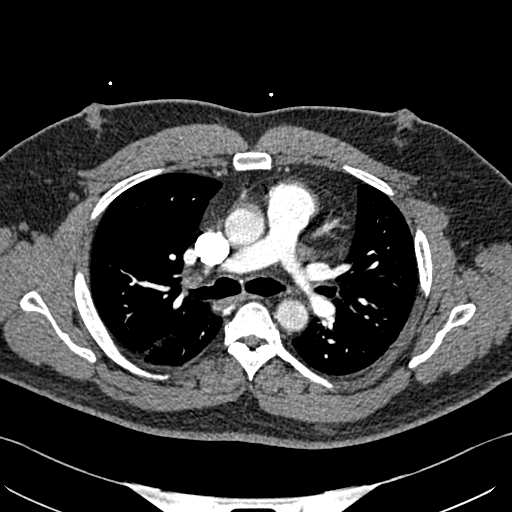
[im 197/283  lung]
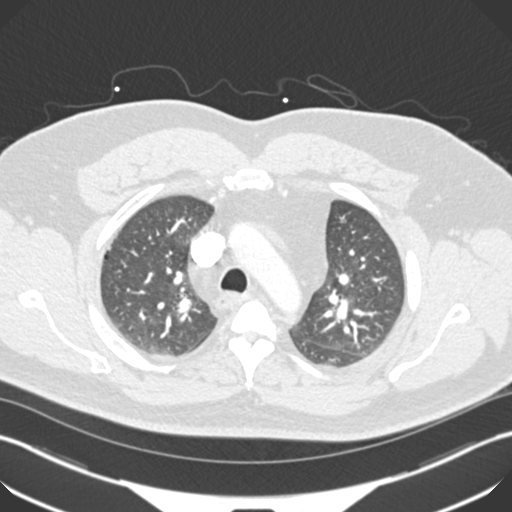
[im 209/283  soft-tissue]
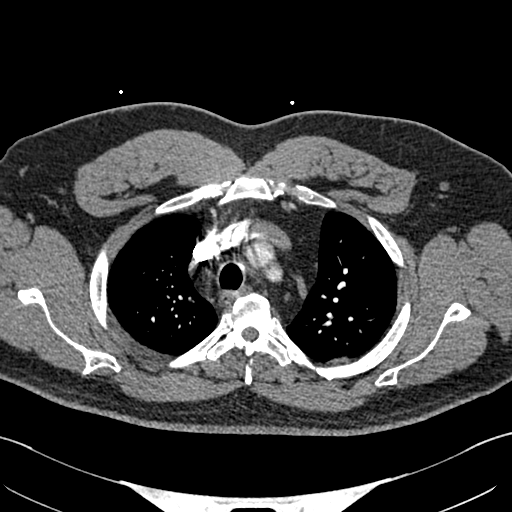
[im 233/283  lung]
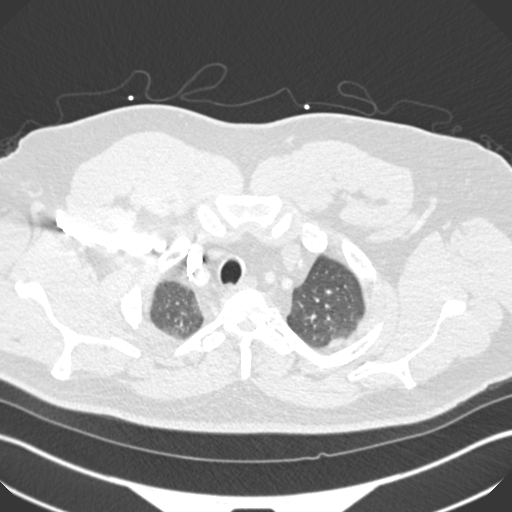
[im 246/283  soft-tissue]
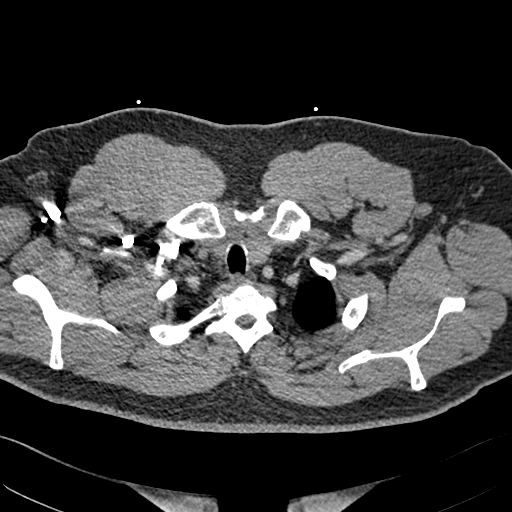
[im 270/283  lung]
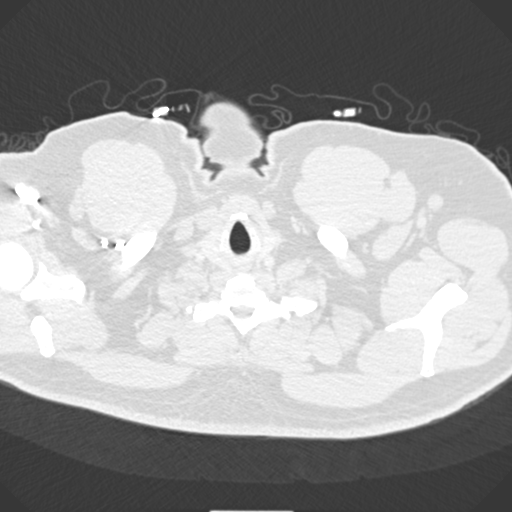

[Series 7: coronal mpr · coronal · 0.57mm/px · 3 of 107 slices shown]
[im 27/107  soft-tissue]
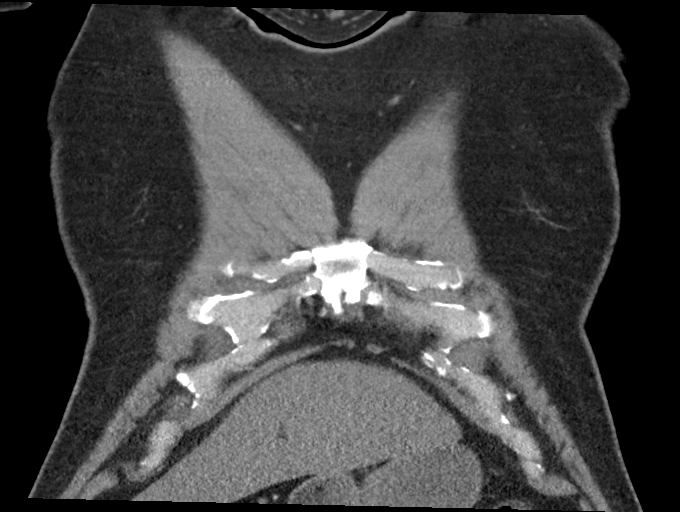
[im 54/107  soft-tissue]
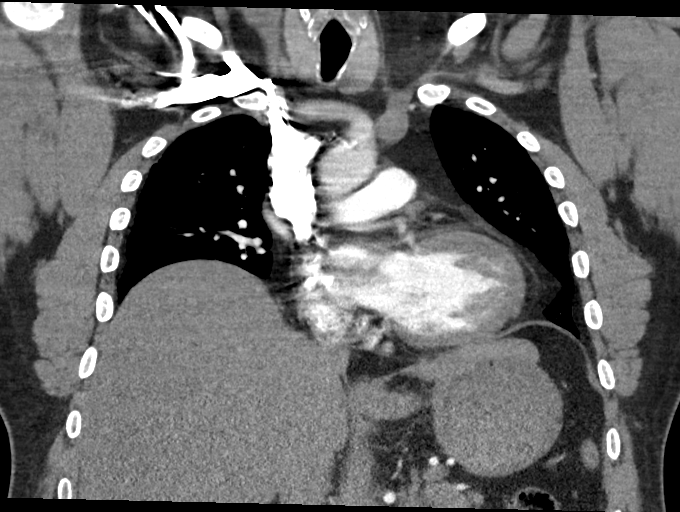
[im 80/107  soft-tissue]
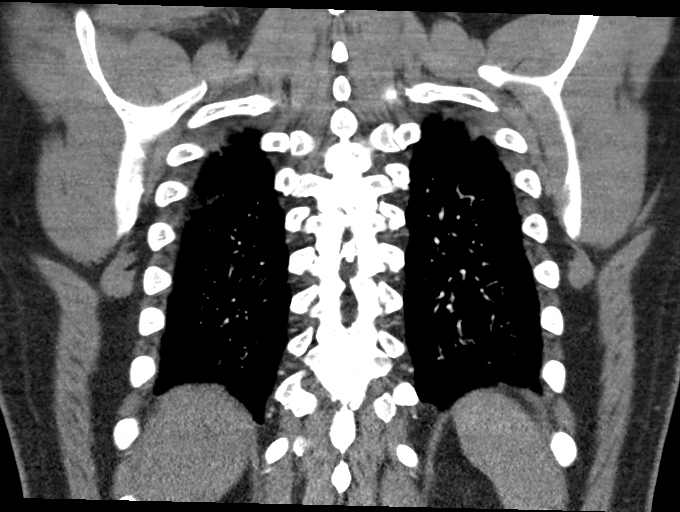

[18 of 46 positions shown; findings below may reference images not displayed]

FINDINGS: Cardiovascular: Mild cardiomegaly. Thoracic aorta is normal caliber
without aneurysm or dissection. Pulmonary arterial system is well
opacified without evidence of emboli. Remaining vascular structures
are unremarkable.

Mediastinum/Nodes: No mediastinal or hilar adenopathy. Remaining
mediastinal structures are normal.

Lungs/Pleura: Lungs are adequately inflated without focal airspace
consolidation or effusion. There is subtle peripheral fibrotic
change over the lateral right mid to upper lung and along the right
major fissure. These findings are unchanged over the visualized
portion of the right lung on the previous abdominal CT 6772. Airways
are normal.

Upper Abdomen: No acute findings.

Musculoskeletal: Unremarkable.

Review of the MIP images confirms the above findings.
IMPRESSION: 1. No evidence of pulmonary embolism and no acute cardiopulmonary
disease.

2.  Mild chronic fibrotic change over the right lung as described.

## 2023-01-17 ENCOUNTER — Encounter: Payer: Self-pay | Admitting: Emergency Medicine

## 2023-01-17 ENCOUNTER — Other Ambulatory Visit: Payer: Self-pay

## 2023-01-17 ENCOUNTER — Ambulatory Visit
Admission: EM | Admit: 2023-01-17 | Discharge: 2023-01-17 | Disposition: A | Discharge status: Home / Self Care | Payer: BC Managed Care – PPO | Attending: Emergency Medicine | Admitting: Emergency Medicine

## 2023-01-17 DIAGNOSIS — L03031 Cellulitis of right toe: Secondary | ICD-10-CM | POA: Diagnosis not present

## 2023-01-17 MED ORDER — DOXYCYCLINE HYCLATE 100 MG PO CAPS: 100.0000 mg | ORAL_CAPSULE | Freq: Two times a day (BID) | ORAL | 0 refills | Status: AC

## 2023-01-17 NOTE — ED Provider Notes (Signed)
Renaldo Fiddler    CSN: [id number] Arrival date & time: 01/17/23  1358      History   Chief Complaint Chief Complaint  Patient presents with   Ingrown Toenail    HPI Vernon Johnson is a 52 y.o. male.   Patient presents for evaluation of discoloration, swelling, tenderness to the left great toe present for 7 days, noticed puslike drainage 1 day ago.  Denies injury or trauma prior to symptoms beginning.  Does not grooming the toenails.  History of diabetes.  Presence of fever.  Past Medical History:  Diagnosis Date   Acquired pes planus    Back pain    Carpal tunnel syndrome    Diabetes mellitus without complication (HCC)    Diverticulitis    Fatty liver    Ganglion cyst    High cholesterol    HLD (hyperlipidemia)    Obesity    Plantar fasciitis    Sleep apnea    Tachycardia     There are no problems to display for this patient.   Past Surgical History:  Procedure Laterality Date   COLONOSCOPY WITH PROPOFOL N/A 01/06/2017   Procedure: COLONOSCOPY WITH PROPOFOL;  Surgeon: Christena Deem, MD;  Location: [facility] ENDOSCOPY;  Service: Endoscopy;  Laterality: N/A;   COLONOSCOPY WITH PROPOFOL N/A 09/07/2021   Procedure: COLONOSCOPY WITH PROPOFOL;  Surgeon: Regis Bill, MD;  Location: [facility] ENDOSCOPY;  Service: Endoscopy;  Laterality: N/A;  IDDM       Home Medications    Prior to Admission medications   Medication Sig Start Date End Date Taking? Authorizing Provider  doxycycline (VIBRAMYCIN) 100 MG capsule Take 1 capsule (100 mg total) by mouth 2 (two) times daily for 10 days. 01/17/23 01/27/23 Yes Valinda Hoar, NP  aspirin 81 MG chewable tablet Chew 81 mg by mouth daily. Patient not taking: Reported on 09/07/2021    [provider]  baclofen (LIORESAL) 10 MG tablet Take 10 mg by mouth at bedtime as needed for muscle spasms.    [provider]  cetirizine (ZYRTEC) 10 MG chewable tablet Chew 10 mg by mouth daily.    [provider]  gabapentin (NEURONTIN) 300 MG capsule Take 300 mg by mouth 2 (two) times daily.    [provider]  glipiZIDE (GLUCOTROL) 10 MG tablet Take 20 mg by mouth 2 (two) times daily before a meal.     [provider]  glucose blood test strip 1 each by Other route as needed for other. Use as instructed    [provider]  HYDROcodone-acetaminophen (NORCO) 5-325 MG tablet Take 1-2 tablets by mouth every 6 (six) hours as needed. Patient not taking: Reported on 09/07/2021 01/04/18   Geoffery Lyons, MD  insulin aspart (NOVOLOG) 100 UNIT/ML injection Inject 36 Units into the skin every morning. 35 units with LUNCH 36 units with DINNER    [provider]  insulin glargine (LANTUS) 100 UNIT/ML injection Inject 80 Units into the skin at bedtime.    [provider]  lidocaine (LIDODERM) 5 % Place 1 patch onto the skin daily. Remove & Discard patch within 12 hours or as directed by MD    [provider]  lisinopril (ZESTRIL) 5 MG tablet Take 5 mg by mouth daily.    [provider]  Melatonin Gummies 2.5 MG CHEW Chew 1 tablet by mouth at bedtime as needed (sleep).    [provider]  mupirocin ointment (BACTROBAN) 2 % Apply two times a  day for 7 days. 02/25/19   Renford Dills, NP  naproxen (NAPROSYN) 500 MG tablet Take 500 mg by mouth 2 (two) times daily with a meal.    [provider]  pantoprazole (PROTONIX) 40 MG tablet Take 1 tablet (40 mg total) by mouth daily. 09/12/17 09/12/18  Minna Antis, MD  pravastatin (PRAVACHOL) 10 MG tablet Take 10 mg by mouth daily.    [provider]  sitaGLIPtin-metformin (JANUMET) 50-1000 MG tablet Take 1 tablet by mouth 2 (two) times daily with a meal.    [provider]    Family History Family History  Problem Relation Age of Onset   Diabetes Mother     Social History Social History   Tobacco Use   Smoking status: Former    Current packs/day: 0.00     Average packs/day: 0.5 packs/day for 12.0 years (6.0 ttl pk-yrs)    Types: Cigarettes    Start date: 05/10/2001    Quit date: 05/10/2013    Years since quitting: 9.6   Smokeless tobacco: Current    Types: Chew, Snuff  Vaping Use   Vaping status: Never Used  Substance Use Topics   Alcohol use: No   Drug use: No     Allergies   Atorvastatin   Review of Systems Review of Systems   Physical Exam Triage Vital Signs ED Triage Vitals [01/17/23 1453]  Encounter Vitals Group     BP 131/86     Systolic BP Percentile      Diastolic BP Percentile      Pulse Rate (!) 105     Resp 18     Temp 99.2 F (37.3 C)     Temp Source Oral     SpO2 95 %     Weight      Height      Head Circumference      Peak Flow      Pain Score      Pain Loc      Pain Education      Exclude from Growth Chart    No data found.  Updated Vital Signs BP 131/86 (BP Location: Left Arm)   Pulse (!) 105   Temp 99.2 F (37.3 C) (Oral)   Resp 18   SpO2 95%   Visual Acuity Right Eye Distance:   Left Eye Distance:   Bilateral Distance:    Right Eye Near:   Left Eye Near:    Bilateral Near:     Physical Exam Constitutional:      Appearance: Normal appearance.  Eyes:     Extraocular Movements: Extraocular movements intact.  Pulmonary:     Effort: Pulmonary effort is normal.  Feet:     Comments: Mild to moderate swelling and tenderness present to the medial aspect of the right great toe, hyperpigmented skin along the medial nailbed, Monzerrath Mcburney to gray puslike drainage expelled with minimal palpation Neurological:     Mental Status: He is alert and oriented to person, place, and time. Mental status is at baseline.      UC Treatments / Results  Labs (all labs ordered are listed, but only abnormal results are displayed) Labs Reviewed - No data to display  EKG   Radiology No results found.  Procedures Procedures (including critical care time)  Medications Ordered in UC Medications - No  data to display  Initial Impression / Assessment and Plan / UC Course  I have reviewed the triage vital signs and the nursing notes.  Pertinent labs & imaging results that were available during my care of the patient were reviewed by me and considered in my medical decision making (see chart for details).  Paronychia of the great right toe  Presentation is consistent with infection, moderate to copious amount of pus expelled with minimal pressure applied, prescribed doxycycline and recommended supportive care through Tylenol, warm compresses and soaks and monitoring, may return for any concerns regarding healing, for further footcare patient has been referred to podiatry as he is a diabetic with history of neuropathy Final Clinical Impressions(s) / UC Diagnoses   Final diagnoses:  Paronychia of great toe, right     Discharge Instructions      Today you are being treated for infection to your right great toenail, at this time may not actually be caused by ingrown  Take doxycycline every morning and every evening for 10 days  May help warm compresses or complete warm soaks to the foot to help reduce swelling and for general comfort, please be careful with temperature of the water due to your neuropathy as it may not seem as hot as it is  May take Tylenol as needed for pain  May follow-up with urgent care as needed if you have any concerns regarding healing  For foot care you may follow-up with the podiatrist, information is on front page   ED Prescriptions     Medication Sig Dispense Auth. Provider   doxycycline (VIBRAMYCIN) 100 MG capsule Take 1 capsule (100 mg total) by mouth 2 (two) times daily for 10 days. 20 capsule Valinda Hoar, NP      PDMP not reviewed this encounter.   Valinda Hoar, NP 01/17/23 1521

## 2023-01-17 NOTE — Discharge Instructions (Signed)
Today you are being treated for infection to your right great toenail, at this time may not actually be caused by ingrown  Take doxycycline every morning and every evening for 10 days  May help warm compresses or complete warm soaks to the foot to help reduce swelling and for general comfort, please be careful with temperature of the water due to your neuropathy as it may not seem as hot as it is  May take Tylenol as needed for pain  May follow-up with urgent care as needed if you have any concerns regarding healing  For foot care you may follow-up with the podiatrist, information is on front page

## 2023-01-17 NOTE — ED Triage Notes (Signed)
Seen by Hansel Starling, np

## 2023-05-22 ENCOUNTER — Emergency Department
Admission: EM | Admit: 2023-05-22 | Discharge: 2023-05-22 | Disposition: A | Discharge status: Home / Self Care | Payer: BC Managed Care – PPO | Attending: Emergency Medicine | Admitting: Emergency Medicine

## 2023-05-22 ENCOUNTER — Emergency Department: Payer: BC Managed Care – PPO

## 2023-05-22 DIAGNOSIS — K529 Noninfective gastroenteritis and colitis, unspecified: Secondary | ICD-10-CM | POA: Diagnosis not present

## 2023-05-22 DIAGNOSIS — R1032 Left lower quadrant pain: Secondary | ICD-10-CM

## 2023-05-22 LAB — COMPREHENSIVE METABOLIC PANEL
ALT: 43 U/L (ref 0–44)
AST: 20 U/L (ref 15–41)
Albumin: 4.5 g/dL (ref 3.5–5.0)
Alkaline Phosphatase: 50 U/L (ref 38–126)
Anion gap: 18 — ABNORMAL HIGH (ref 5–15)
BUN: 23 mg/dL — ABNORMAL HIGH (ref 6–20)
CO2: 21 mmol/L — ABNORMAL LOW (ref 22–32)
Calcium: 8.7 mg/dL — ABNORMAL LOW (ref 8.9–10.3)
Chloride: 92 mmol/L — ABNORMAL LOW (ref 98–111)
Creatinine, Ser: 1.21 mg/dL (ref 0.61–1.24)
GFR, Estimated: >60 mL/min (ref >60)
Glucose, Bld: 359 mg/dL — ABNORMAL HIGH (ref 70–99)
Potassium: 3.9 mmol/L (ref 3.5–5.1)
Sodium: 131 mmol/L — ABNORMAL LOW (ref 135–145)
Total Bilirubin: 1.6 mg/dL — ABNORMAL HIGH (ref 0.0–1.2)
Total Protein: 8.1 g/dL (ref 6.5–8.1)

## 2023-05-22 LAB — CBC
HCT: 51.9 % (ref 39.0–52.0)
Hemoglobin: 17.5 g/dL — ABNORMAL HIGH (ref 13.0–17.0)
MCH: 28.9 pg (ref 26.0–34.0)
MCHC: 33.7 g/dL (ref 30.0–36.0)
MCV: 85.8 fL (ref 80.0–100.0)
Platelets: 294 10*3/uL (ref 150–400)
RBC: 6.05 MIL/uL — ABNORMAL HIGH (ref 4.22–5.81)
RDW: 12.6 % (ref 11.5–15.5)
WBC: 8.5 10*3/uL (ref 4.0–10.5)
nRBC: 0.2 % (ref 0.0–0.2)

## 2023-05-22 LAB — LIPASE, BLOOD: Lipase: 20 U/L (ref 11–51)

## 2023-05-22 MED ORDER — IOHEXOL 300 MG/ML  SOLN
100.0000 mL | Freq: Once | INTRAMUSCULAR | Status: AC | PRN
  Administered 2023-05-22: 100 mL via INTRAVENOUS

## 2023-05-22 MED ORDER — ONDANSETRON 4 MG PO TBDP: 4.0000 mg | ORAL_TABLET | Freq: Three times a day (TID) | ORAL | 0 refills | Status: DC | PRN

## 2023-05-22 MED ORDER — MORPHINE SULFATE (PF) 4 MG/ML IV SOLN
4.0000 mg | Freq: Once | INTRAVENOUS | Status: AC
  Administered 2023-05-22: 4 mg via INTRAVENOUS
  Filled 2023-05-22: qty 1

## 2023-05-22 MED ORDER — ONDANSETRON 4 MG PO TBDP
4.0000 mg | ORAL_TABLET | Freq: Once | ORAL | Status: AC
  Administered 2023-05-22: 4 mg via ORAL
  Filled 2023-05-22: qty 1

## 2023-05-22 MED ORDER — SODIUM CHLORIDE 0.9 % IV BOLUS
500.0000 mL | Freq: Once | INTRAVENOUS | Status: AC
  Administered 2023-05-22: 500 mL via INTRAVENOUS

## 2023-05-22 MED ORDER — ONDANSETRON HCL 4 MG/2ML IJ SOLN
4.0000 mg | Freq: Once | INTRAMUSCULAR | Status: AC
  Administered 2023-05-22: 4 mg via INTRAVENOUS
  Filled 2023-05-22: qty 2

## 2023-05-22 NOTE — ED Provider Notes (Signed)
 [facility] Provider Note    Event Date/Time   First MD Initiated Contact with Patient 05/22/23 1642     (approximate)   History   Abdominal Pain, Emesis, and Diarrhea   HPI  Vernon Johnson is a 53 y.o. male with history of diverticulitis who presents with complaints of lower abdominal pain, nausea and some loose stools over the last few days.  He reports the pain is consistent.  No fevers reported.  Does report a history of diverticulitis and this feels somewhat similar     Physical Exam   Triage Vital Signs: ED Triage Vitals  Encounter Vitals Group     BP 05/22/23 1444 117/86     Systolic BP Percentile --      Diastolic BP Percentile --      Pulse Rate 05/22/23 1444 (!) 121     Resp 05/22/23 1444 20     Temp 05/22/23 1444 98 F (36.7 C)     Temp Source 05/22/23 1444 Oral     SpO2 05/22/23 1444 96 %     Weight 05/22/23 1445 113.4 kg (250 lb)     Height 05/22/23 1445 1.778 m (5' 10)     Head Circumference --      Peak Flow --      Pain Score 05/22/23 1445 8     Pain Loc --      Pain Education --      Exclude from Growth Chart --     Most recent vital signs: Vitals:   05/22/23 1444 05/22/23 1800  BP: 117/86 122/80  Pulse: (!) 121 (!) 115  Resp: 20 18  Temp: 98 F (36.7 C) 98.5 F (36.9 C)  SpO2: 96% 96%     General: Awake, no distress.  CV:  Good peripheral perfusion.  Resp:  Normal effort.  Abd:  No distention.  Tenderness primarily in the left lower quadrant Other:     ED Results / Procedures / Treatments   Labs (all labs ordered are listed, but only abnormal results are displayed) Labs Reviewed  COMPREHENSIVE METABOLIC PANEL - Abnormal; Notable for the following components:      Result Value   Sodium 131 (*)    Chloride 92 (*)    CO2 21 (*)    Glucose, Bld 359 (*)    BUN 23 (*)    Calcium 8.7 (*)    Total Bilirubin 1.6 (*)    Anion gap 18 (*)    All other components within normal limits  CBC - Abnormal;  Notable for the following components:   RBC 6.05 (*)    Hemoglobin 17.5 (*)    All other components within normal limits  LIPASE, BLOOD  URINALYSIS, ROUTINE W REFLEX MICROSCOPIC     EKG     RADIOLOGY CT scan viewed interpret by me, do not see evidence of diverticulitis, pending radiology read  Radiology notes findings suggestive of diarrheal illness   PROCEDURES:  Critical Care performed:   Procedures   MEDICATIONS ORDERED IN ED: Medications  ondansetron  (ZOFRAN -ODT) disintegrating tablet 4 mg (has no administration in time range)  morphine  (PF) 4 MG/ML injection 4 mg (4 mg Intravenous Given 05/22/23 1747)  ondansetron  (ZOFRAN ) injection 4 mg (4 mg Intravenous Given 05/22/23 1747)  sodium chloride  0.9 % bolus 500 mL (500 mLs Intravenous New Bag/Given 05/22/23 1749)  iohexol  (OMNIPAQUE ) 300 MG/ML solution 100 mL (100 mLs Intravenous Contrast Given 05/22/23 1815)     IMPRESSION /  MDM / ASSESSMENT AND PLAN / ED COURSE  I reviewed the triage vital signs and the nursing notes. Patient's presentation is most consistent with acute presentation with potential threat to life or bodily function.  Patient presents with lower abdominal pain as detailed above, differential includes viral illness, diverticulitis, diverticular abscess, less likely appendicitis.  Will treat with IV morphine , IV Zofran , IV fluids obtain CT abdomen pelvis and reevaluate.  CT scan overall reassuring, on reeval patient feeling much better, heart rate has normalized.  No tenderness in the left lower quadrant.  Discussed CT findings with the patient, we agreed that discharge is appropriate, if any worsening pain the patient will return to the emergency department,      FINAL CLINICAL IMPRESSION(S) / ED DIAGNOSES   Final diagnoses:  Gastroenteritis  Left lower quadrant abdominal pain     Rx / DC Orders   ED Discharge Orders          Ordered    ondansetron  (ZOFRAN -ODT) 4 MG disintegrating tablet   Every 8 hours PRN        05/22/23 1902             Note:  This document was prepared using Dragon voice recognition software and may include unintentional dictation errors.   Arlander Charleston, MD 05/22/23 [phone]

## 2023-05-22 NOTE — ED Provider Triage Note (Signed)
 Emergency Medicine Provider Triage Evaluation Note  Vernon Johnson , a 53 y.o. male  was evaluated in triage.  Pt complains of right side abdominal pain with diarrhea and nausea. Symptoms started 2 days ago. No known fever.   Physical Exam  BP 117/86 (BP Location: Left Arm)   Pulse (!) 121   Temp 98 F (36.7 C) (Oral)   Resp 20   Ht 5' 10 (1.778 m)   Wt 113.4 kg   SpO2 96%   BMI 35.87 kg/m  Gen:   Awake, no distress   Resp:  Normal effort  MSK:   Moves extremities without difficulty  Other:  Abdomen is soft. No rebound tenderness.  Medical Decision Making  Medically screening exam initiated at 2:48 PM.  Appropriate orders placed.  Vernon Johnson was informed that the remainder of the evaluation will be completed by another provider, this initial triage assessment does not replace that evaluation, and the importance of remaining in the ED until their evaluation is complete.  RUQ US  and labs ordered.   Herlinda Kirk NOVAK, FNP 05/22/23 1451

## 2023-05-22 NOTE — ED Triage Notes (Signed)
 Pt c/o R side abdominal pain and n/v/d x2 days.  Pain score 8/10.  Hx of diverticulitis.

## 2023-12-23 ENCOUNTER — Encounter: Payer: Self-pay | Admitting: Emergency Medicine

## 2023-12-23 ENCOUNTER — Other Ambulatory Visit: Payer: Self-pay

## 2023-12-23 ENCOUNTER — Emergency Department

## 2023-12-23 ENCOUNTER — Emergency Department
Admission: EM | Admit: 2023-12-23 | Discharge: 2023-12-23 | Disposition: A | Discharge status: Home / Self Care | Attending: Emergency Medicine | Admitting: Emergency Medicine

## 2023-12-23 DIAGNOSIS — K859 Acute pancreatitis without necrosis or infection, unspecified: Secondary | ICD-10-CM | POA: Diagnosis not present

## 2023-12-23 DIAGNOSIS — K2980 Duodenitis without bleeding: Secondary | ICD-10-CM | POA: Diagnosis not present

## 2023-12-23 DIAGNOSIS — R1011 Right upper quadrant pain: Secondary | ICD-10-CM | POA: Diagnosis present

## 2023-12-23 LAB — URINALYSIS, ROUTINE W REFLEX MICROSCOPIC
Bilirubin Urine: NEGATIVE
Glucose, UA: NEGATIVE mg/dL
Hgb urine dipstick: NEGATIVE
Ketones, ur: NEGATIVE mg/dL
Leukocytes,Ua: NEGATIVE
Nitrite: NEGATIVE
Protein, ur: NEGATIVE mg/dL
Specific Gravity, Urine: 1.019 (ref 1.005–1.030)
pH: 8 (ref 5.0–8.0)

## 2023-12-23 LAB — CBC
HCT: 44.5 % (ref 39.0–52.0)
Hemoglobin: 14.8 g/dL (ref 13.0–17.0)
MCH: 28.7 pg (ref 26.0–34.0)
MCHC: 33.3 g/dL (ref 30.0–36.0)
MCV: 86.2 fL (ref 80.0–100.0)
Platelets: 309 K/uL (ref 150–400)
RBC: 5.16 MIL/uL (ref 4.22–5.81)
RDW: 12.4 % (ref 11.5–15.5)
WBC: 7.8 K/uL (ref 4.0–10.5)
nRBC: 0 % (ref 0.0–0.2)

## 2023-12-23 LAB — COMPREHENSIVE METABOLIC PANEL WITH GFR
ALT: 29 U/L (ref 0–44)
AST: 23 U/L (ref 15–41)
Albumin: 4.1 g/dL (ref 3.5–5.0)
Alkaline Phosphatase: 48 U/L (ref 38–126)
Anion gap: 11 (ref 5–15)
BUN: 12 mg/dL (ref 6–20)
CO2: 25 mmol/L (ref 22–32)
Calcium: 9.5 mg/dL (ref 8.9–10.3)
Chloride: 102 mmol/L (ref 98–111)
Creatinine, Ser: 0.71 mg/dL (ref 0.61–1.24)
GFR, Estimated: >60 mL/min (ref >60)
Glucose, Bld: 114 mg/dL — ABNORMAL HIGH (ref 70–99)
Potassium: 3.8 mmol/L (ref 3.5–5.1)
Sodium: 138 mmol/L (ref 135–145)
Total Bilirubin: 1.1 mg/dL (ref 0.0–1.2)
Total Protein: 7.9 g/dL (ref 6.5–8.1)

## 2023-12-23 LAB — TRIGLYCERIDES: Triglycerides: 67 mg/dL (ref <150)

## 2023-12-23 LAB — TROPONIN I (HIGH SENSITIVITY)
Troponin I (High Sensitivity): 4 ng/L (ref <18)
Troponin I (High Sensitivity): 3 ng/L (ref <18)

## 2023-12-23 LAB — LIPASE, BLOOD: Lipase: 55 U/L — ABNORMAL HIGH (ref 11–51)

## 2023-12-23 MED ORDER — PANTOPRAZOLE SODIUM 40 MG IV SOLR
40.0000 mg | Freq: Once | INTRAVENOUS | Status: AC
  Administered 2023-12-23: 40 mg via INTRAVENOUS
  Filled 2023-12-23: qty 10

## 2023-12-23 MED ORDER — OXYCODONE HCL 5 MG PO TABS: 5.0000 mg | ORAL_TABLET | Freq: Four times a day (QID) | ORAL | 0 refills | Status: AC | PRN

## 2023-12-23 MED ORDER — PANTOPRAZOLE SODIUM 40 MG PO TBEC: 40.0000 mg | DELAYED_RELEASE_TABLET | Freq: Two times a day (BID) | ORAL | 0 refills | Status: AC

## 2023-12-23 MED ORDER — IOHEXOL 300 MG/ML  SOLN
100.0000 mL | Freq: Once | INTRAMUSCULAR | Status: AC | PRN
  Administered 2023-12-23: 100 mL via INTRAVENOUS

## 2023-12-23 MED ORDER — OXYCODONE HCL 5 MG PO TABS
5.0000 mg | ORAL_TABLET | Freq: Once | ORAL | Status: AC
  Administered 2023-12-23: 5 mg via ORAL
  Filled 2023-12-23: qty 1

## 2023-12-23 MED ORDER — SODIUM CHLORIDE 0.9 % IV BOLUS
1000.0000 mL | Freq: Once | INTRAVENOUS | Status: AC
  Administered 2023-12-23: 1000 mL via INTRAVENOUS

## 2023-12-23 MED ORDER — ONDANSETRON 4 MG PO TBDP: 4.0000 mg | ORAL_TABLET | Freq: Three times a day (TID) | ORAL | 0 refills | Status: AC | PRN

## 2023-12-23 NOTE — Discharge Instructions (Addendum)
 We were going to treat you as possible pancreatitis versus duodenitis.  You to call GI to make a follow-up appointment for your CT scan and ultrasound below but you should return to the ER if you are not able to tolerate p.o., worsening pain or any other concerns.  AVOID alcohol, NSAID, ibuprofen.   IMPRESSION: 1. No cholecystolithiasis or changes of acute cholecystitis. 2. Hepatic steatosis.  1. Subtle edema adjacent to the pancreatic head and uncinate process, extending  caudally into the right anterior perirenal space. Suspect mild pancreatitis,  especially given elevated lipase earlier this morning. Duodenitis could look  similar.  2. Apparent eccentric left rectal wall thickening, possibly due to  underdistention. Correlate with colon cancer screening history.

## 2023-12-23 NOTE — ED Provider Notes (Addendum)
 [facility] Provider Note    Event Date/Time   First MD Initiated Contact with Patient 12/23/23 [phone]     (approximate)   History   Abdominal Pain   HPI  Vernon Johnson is a 53 y.o. male with diabetes who comes in with concerns for abdominal pain.  Patient reports that he is concerned he is having a flareup of his diverticulitis.  Reports history of the same.  Patient however reported that this diverticulitis flareup was years ago.  He reports pain is on the right side.  He reports that it is in his right upper abdomen.  He denies any chest pain, shortness of breath, history of blood clots or difficulties with breathing or pain with breathing.  He reports it is all in his upper abdomen.  Denies any nausea vomiting diarrhea associated with it.  No fevers but family do report that he was really sweaty this morning.   Physical Exam   Triage Vital Signs: ED Triage Vitals [12/23/23 0518]  Encounter Vitals Group     BP (!) 127/98     Girls Systolic BP Percentile      Girls Diastolic BP Percentile      Boys Systolic BP Percentile      Boys Diastolic BP Percentile      Pulse Rate (!) 103     Resp 18     Temp 98 F (36.7 C)     Temp Source Oral     SpO2 98 %     Weight 255 lb (115.7 kg)     Height      Head Circumference      Peak Flow      Pain Score 9     Pain Loc      Pain Education      Exclude from Growth Chart     Most recent vital signs: Vitals:   12/23/23 0518 12/23/23 0930  BP: (!) 127/98 (!) 140/88  Pulse: (!) 103 94  Resp: 18 16  Temp: 98 F (36.7 C)   SpO2: 98% 98%     General: Awake, no distress.  CV:  Good peripheral perfusion.  Resp:  Normal effort.  Abd:  No distention.  Tender more in the right upper abdomen without any rebound, guarding Other:  No swelling in legs.  No calf tenderness   ED Results / Procedures / Treatments   Labs (all labs ordered are listed, but only abnormal results are displayed) Labs  Reviewed  LIPASE, BLOOD - Abnormal; Notable for the following components:      Result Value   Lipase 55 (*)    All other components within normal limits  COMPREHENSIVE METABOLIC PANEL WITH GFR - Abnormal; Notable for the following components:   Glucose, Bld 114 (*)    All other components within normal limits  URINALYSIS, ROUTINE W REFLEX MICROSCOPIC - Abnormal; Notable for the following components:   Color, Urine YELLOW (*)    APPearance CLEAR (*)    All other components within normal limits  CBC  TROPONIN I (HIGH SENSITIVITY)  TROPONIN I (HIGH SENSITIVITY)     EKG  My interpretation of EKG:  Sinus rhythm 90 without any ST elevation or T wave inversions, normal intervals  RADIOLOGY I have reviewed the ct personally and interpreted no kidney stones    PROCEDURES:  Critical Care performed: No  Procedures   MEDICATIONS ORDERED IN ED: Medications  oxyCODONE  (Oxy IR/ROXICODONE ) immediate release tablet 5 mg (5  mg Oral Given 12/23/23 1030)  iohexol  (OMNIPAQUE ) 300 MG/ML solution 100 mL (100 mLs Intravenous Contrast Given 12/23/23 1108)  sodium chloride  0.9 % bolus 1,000 mL (1,000 mLs Intravenous New Bag/Given 12/23/23 1212)  pantoprazole  (PROTONIX ) injection 40 mg (40 mg Intravenous Given 12/23/23 1212)     IMPRESSION / MDM / ASSESSMENT AND PLAN / ED COURSE  I reviewed the triage vital signs and the nursing notes.   Patient's presentation is most consistent with acute presentation with potential threat to life or bodily function.   Patient comes in with upper abdominal pain CT imaging ordered evaluate for obstruction, perforation, ultrasound evaluate for any gallstones.  His description of where the pain is that does not seem consistent with diverticulitis therefore I have also added on EKG, cardiac markers to ensure no ACS.  I considered the possibility of pulmonary embolism but he denies any shortness of breath, pain with breathing or history of such.  His EKG was without  any concerning T wave inversions.  Will proceed with just the CT scan of his abdomen.  Urine without evidence of UTI.  Lipase slightly elevated at 55.  CMP reassuring CBC reassuring  I reviewed a CT scan from 05/22/2023 where there was concern for a small bowel volvulus.  No obvious diverticulitis.  IMPRESSION: 1. No cholecystolithiasis or changes of acute cholecystitis. 2. Hepatic steatosis.  IMPRESSION: 1. Subtle edema adjacent to the pancreatic head and uncinate process, extending caudally into the right anterior perirenal space. Suspect mild pancreatitis, especially given elevated lipase earlier this morning. Duodenitis could look similar. 2. Apparent eccentric left rectal wall thickening, possibly due to underdistention. Correlate with colon cancer screening history.  Given patient was pain was more on the right side I wonder if this could actually be duodenitis sevenths his lipase level is only slightly elevated.  He has normal ultrasound he had triglyceride level that was negative.    Considered admission for pancreatitis versus duodenitis but patient is tolerating p.o. we discussed starting a small low-fat diet and given he can tolerate p.o. he feels comfortable discharge home.  We did discuss his abnormal CT scan and need to follow-up with GI to discuss a colonoscopy and possible endoscopy.  He expressed understanding felt comfortable with this plan  Given the concern more for duodenitis will start on PPI twice daily, Zofran , give a short course of oxycodone  and recommended using some MiraLAX with this to prevent constipation  He denies any blood in stool/black stool. Denies alcohol or NSAID use.   The patient is on the cardiac monitor to evaluate for evidence of arrhythmia and/or significant heart rate changes.      FINAL CLINICAL IMPRESSION(S) / ED DIAGNOSES   Final diagnoses:  Duodenitis  Acute pancreatitis, unspecified complication status, unspecified pancreatitis type      Rx / DC Orders   ED Discharge Orders          Ordered    oxyCODONE  (ROXICODONE ) 5 MG immediate release tablet  Every 6 hours PRN        12/23/23 1319    pantoprazole  (PROTONIX ) 40 MG tablet  2 times daily        12/23/23 1319    ondansetron  (ZOFRAN -ODT) 4 MG disintegrating tablet  Every 8 hours PRN        12/23/23 1319             Note:  This document was prepared using Dragon voice recognition software and may include unintentional dictation errors.   Ernest,  Ronal BRAVO, MD 12/23/23 1324    Ernest Ronal BRAVO, MD 12/23/23 [phone]

## 2023-12-23 NOTE — ED Notes (Signed)
 Ultrasound at bedside

## 2023-12-23 NOTE — ED Triage Notes (Signed)
 Patient states I think I am having a diverticulitis flare up.  Patient reports history of same.

## 2024-04-19 ENCOUNTER — Encounter: Payer: Self-pay | Admitting: Anesthesiology

## 2024-04-19 ENCOUNTER — Ambulatory Visit
Admission: RE | Admit: 2024-04-19 | Discharge: 2024-04-19 | Disposition: A | Discharge status: Home / Self Care | Attending: Gastroenterology | Admitting: Gastroenterology

## 2024-04-19 ENCOUNTER — Encounter
Admission: RE | Disposition: A | Discharge status: Home / Self Care | Payer: Self-pay | Source: Home / Self Care | Attending: Gastroenterology

## 2024-04-19 DIAGNOSIS — Z1211 Encounter for screening for malignant neoplasm of colon: Secondary | ICD-10-CM | POA: Diagnosis not present

## 2024-04-19 DIAGNOSIS — Z538 Procedure and treatment not carried out for other reasons: Secondary | ICD-10-CM | POA: Diagnosis not present

## 2024-04-19 DIAGNOSIS — Z860101 Personal history of adenomatous and serrated colon polyps: Secondary | ICD-10-CM | POA: Diagnosis present

## 2024-04-19 SURGERY — COLONOSCOPY
Anesthesia: General

## 2024-04-19 MED ORDER — SODIUM CHLORIDE 0.9 % IV SOLN: INTRAVENOUS | Status: DC

## 2024-04-19 NOTE — Anesthesia Preprocedure Evaluation (Deleted)
 Anesthesia Evaluation    Airway Mallampati: III       Dental   Pulmonary sleep apnea and Continuous Positive Airway Pressure Ventilation , former smoker (quit 2015)          Cardiovascular   ECG 12/23/23:  Sinus rhythm Low voltage, extremity and precordial leads Baseline wander in lead(s) V2   Neuro/Psych  Neuromuscular disease (diabetic retinopathy and neuropathy)    GI/Hepatic   Endo/Other  diabetes, Type 2, Insulin Dependent  Obesity   Renal/GU      Musculoskeletal   Abdominal   Peds  Hematology   Anesthesia Other Findings   Reproductive/Obstetrics                              Anesthesia Physical Anesthesia Plan  ASA: 3  Anesthesia Plan: General   Post-op Pain Management:    Induction:   PONV Risk Score and Plan:   Airway Management Planned: Natural Airway  Additional Equipment:   Intra-op Plan:   Post-operative Plan:   Informed Consent:   Plan Discussed with:   Anesthesia Plan Comments:          Anesthesia Quick Evaluation

## 2024-04-19 NOTE — OR Nursing (Signed)
 Pt arrived to ENDO WITH STOOL L;IQUID DARK BROWN. PT DID MIRALAX AND SUPREP AS ORDERED. PT WAS GIVEN OPTION BY DR RUSSO TO DO UPPER PROCEDURE . PT DECLINED REPORTS HE WANTS BOTH PROCEDURES AT 1 TIME. REPORTS HE WUOLD LIKE TO STILL BE DONE THIS MONTH.DISCHARGE HOME. OFFICE TO RE-SCHEDULE

## 2024-04-19 NOTE — H&P (Signed)
 Patient not clean for procedure. Will reschedule  Vernon EMERSON Jungling, DO [facility]

## 2024-04-26 ENCOUNTER — Other Ambulatory Visit: Payer: Self-pay

## 2024-04-26 ENCOUNTER — Ambulatory Visit
Admission: RE | Admit: 2024-04-26 | Discharge: 2024-04-26 | Disposition: A | Discharge status: Home / Self Care | Attending: Gastroenterology | Admitting: Gastroenterology

## 2024-04-26 ENCOUNTER — Encounter: Payer: Self-pay | Admitting: Gastroenterology

## 2024-04-26 ENCOUNTER — Ambulatory Visit: Admitting: Registered Nurse

## 2024-04-26 ENCOUNTER — Encounter: Admission: RE | Disposition: A | Payer: Self-pay | Attending: Gastroenterology

## 2024-04-26 DIAGNOSIS — K5730 Diverticulosis of large intestine without perforation or abscess without bleeding: Secondary | ICD-10-CM | POA: Insufficient documentation

## 2024-04-26 DIAGNOSIS — D124 Benign neoplasm of descending colon: Secondary | ICD-10-CM | POA: Diagnosis not present

## 2024-04-26 DIAGNOSIS — Z87891 Personal history of nicotine dependence: Secondary | ICD-10-CM | POA: Diagnosis not present

## 2024-04-26 DIAGNOSIS — E66813 Obesity, class 3: Secondary | ICD-10-CM | POA: Insufficient documentation

## 2024-04-26 DIAGNOSIS — Z09 Encounter for follow-up examination after completed treatment for conditions other than malignant neoplasm: Secondary | ICD-10-CM | POA: Insufficient documentation

## 2024-04-26 DIAGNOSIS — E119 Type 2 diabetes mellitus without complications: Secondary | ICD-10-CM | POA: Insufficient documentation

## 2024-04-26 DIAGNOSIS — Z6836 Body mass index (BMI) 36.0-36.9, adult: Secondary | ICD-10-CM | POA: Insufficient documentation

## 2024-04-26 DIAGNOSIS — G4730 Sleep apnea, unspecified: Secondary | ICD-10-CM | POA: Diagnosis not present

## 2024-04-26 HISTORY — PX: COLONOSCOPY: SHX5424

## 2024-04-26 HISTORY — PX: ESOPHAGOGASTRODUODENOSCOPY: SHX5428

## 2024-04-26 HISTORY — PX: POLYPECTOMY: SHX149

## 2024-04-26 LAB — GLUCOSE, CAPILLARY: Glucose-Capillary: 116 mg/dL — ABNORMAL HIGH (ref 70–99)

## 2024-04-26 SURGERY — COLONOSCOPY
Anesthesia: General

## 2024-04-26 MED ORDER — SODIUM CHLORIDE 0.9 % IV SOLN: INTRAVENOUS | Status: DC

## 2024-04-26 MED ORDER — PROPOFOL 10 MG/ML IV BOLUS
INTRAVENOUS | Status: DC | PRN
  Administered 2024-04-26: 10:00:00 100 ug/kg/min via INTRAVENOUS
  Administered 2024-04-26: 10:00:00 100 mg via INTRAVENOUS
  Administered 2024-04-26 (×2): 50 mg via INTRAVENOUS

## 2024-04-26 MED ORDER — DEXMEDETOMIDINE HCL IN NACL 80 MCG/20ML IV SOLN
INTRAVENOUS | Status: DC | PRN
  Administered 2024-04-26 (×2): 8 ug via INTRAVENOUS
  Administered 2024-04-26: 10:00:00 4 ug via INTRAVENOUS

## 2024-04-26 MED ORDER — LIDOCAINE HCL (CARDIAC) PF 100 MG/5ML IV SOSY
PREFILLED_SYRINGE | INTRAVENOUS | Status: DC | PRN
  Administered 2024-04-26: 10:00:00 100 mg via INTRAVENOUS

## 2024-04-26 NOTE — Anesthesia Procedure Notes (Signed)
 Procedure Name: MAC Date/Time: 04/26/2024 9:52 AM  Performed by: Lorrene Camelia LABOR, CRNAPre-anesthesia Checklist: Patient identified, Emergency Drugs available, Suction available and Patient being monitored Patient Re-evaluated:Patient Re-evaluated prior to induction Oxygen Delivery Method: Simple face mask Preoxygenation: Pre-oxygenation with 100% oxygen Induction Type: IV induction Comments: pom

## 2024-04-26 NOTE — Anesthesia Postprocedure Evaluation (Signed)
 Anesthesia Post Note  Patient: Vernon Johnson  Procedure(s) Performed: COLONOSCOPY EGD (ESOPHAGOGASTRODUODENOSCOPY) POLYPECTOMY, INTESTINE  Patient location during evaluation: PACU Anesthesia Type: General Level of consciousness: awake and alert Pain management: pain level controlled Vital Signs Assessment: post-procedure vital signs reviewed and stable Respiratory status: spontaneous breathing, nonlabored ventilation, respiratory function stable and patient connected to nasal cannula oxygen Cardiovascular status: blood pressure returned to baseline and stable Postop Assessment: no apparent nausea or vomiting Anesthetic complications: no   No notable events documented.   Last Vitals:  Vitals:   04/26/24 1032 04/26/24 1041  BP: 95/67 109/84  Pulse: 87 89  Resp: (!) 23 (!) 27  Temp: (!) 36 C   SpO2: 97% 98%    Last Pain:  Vitals:   04/26/24 1041  TempSrc:   PainSc: 0-No pain                 Lynwood KANDICE Clause

## 2024-04-26 NOTE — Transfer of Care (Signed)
 Immediate Anesthesia Transfer of Care Note  Patient: Vernon Johnson  Procedure(s) Performed: COLONOSCOPY EGD (ESOPHAGOGASTRODUODENOSCOPY) POLYPECTOMY, INTESTINE  Patient Location: Endoscopy Unit  Anesthesia Type:General  Level of Consciousness: drowsy and patient cooperative  Airway & Oxygen Therapy: Patient Spontanous Breathing  Post-op Assessment: Report given to RN and Post -op Vital signs reviewed and stable  Post vital signs: Reviewed and stable  Last Vitals:  Vitals Value Taken Time  BP 95/67 04/26/24 10:32  Temp 36 C 04/26/24 10:32  Pulse 87 04/26/24 10:32  Resp 23 04/26/24 10:32  SpO2 97 % 04/26/24 10:32    Last Pain:  Vitals:   04/26/24 1032  TempSrc: Tympanic  PainSc:          Complications: No notable events documented.

## 2024-04-26 NOTE — Op Note (Signed)
 [facility] Patient Name: Vernon Johnson Procedure Date: 04/26/2024 9:38 AM MRN: [id number] Account #: [id number] Date of Birth: 1971-04-26 Admit Type: Outpatient Age: 53 Room: [facility] ENDO ROOM 1 Gender: Male Note Status: Finalized Instrument Name: Colon Scope [id number] Procedure:             Colonoscopy Indications:           High risk colon cancer surveillance: Personal history                         of colonic polyps Providers:             [name] DO, DO Referring MD:          Sionne A. Zachary MD, MD (Referring MD) Medicines:             Monitored Anesthesia Care Complications:         No immediate complications. Estimated blood loss:                         Minimal. Procedure:             Pre-Anesthesia Assessment:                        - Prior to the procedure, a History and Physical was                         performed, and patient medications and allergies were                         reviewed. The patient is competent. The risks and                         benefits of the procedure and the sedation options and                         risks were discussed with the patient. All questions                         were answered and informed consent was obtained.                         Patient identification and proposed procedure were                         verified by the physician, the nurse, the anesthetist                         and the technician in the endoscopy suite. Mental                         Status Examination: alert and oriented. Airway                         Examination: normal oropharyngeal airway and neck                         mobility. Respiratory Examination: clear to  auscultation. CV Examination: RRR, no murmurs, no S3                         or S4. Prophylactic Antibiotics: The patient does not                         require prophylactic antibiotics. Prior                          Anticoagulants: The patient has taken no anticoagulant                         or antiplatelet agents. ASA Grade Assessment: III - A                         patient with severe systemic disease. After reviewing                         the risks and benefits, the patient was deemed in                         satisfactory condition to undergo the procedure. The                         anesthesia plan was to use monitored anesthesia care                         (MAC). Immediately prior to administration of                         medications, the patient was re-assessed for adequacy                         to receive sedatives. The heart rate, respiratory                         rate, oxygen saturations, blood pressure, adequacy of                         pulmonary ventilation, and response to care were                         monitored throughout the procedure. The physical                         status of the patient was re-assessed after the                         procedure.                        After obtaining informed consent, the colonoscope was                         passed under direct vision. Throughout the procedure,                         the patient's blood pressure, pulse, and oxygen  saturations were monitored continuously. The                         Colonoscope was introduced through the anus and                         advanced to the the cecum, identified by appendiceal                         orifice and ileocecal valve. The colonoscopy was                         performed without difficulty. The patient tolerated                         the procedure well. The quality of the bowel                         preparation was evaluated using the BBPS [facility] Bowel                         Preparation Scale) with scores of: Right Colon = 2                         (minor amount of residual staining, small fragments of                         stool  and/or opaque liquid, but mucosa seen well),                         Transverse Colon = 2 (minor amount of residual                         staining, small fragments of stool and/or opaque                         liquid, but mucosa seen well) and Left Colon = 2                         (minor amount of residual staining, small fragments of                         stool and/or opaque liquid, but mucosa seen well). The                         total BBPS score equals 6. The quality of the bowel                         preparation was good. The ileocecal valve, appendiceal                         orifice, and rectum were photographed. Findings:      The perianal and digital rectal examinations were normal. Pertinent       negatives include normal sphincter tone.      Retroflexion in the right colon was performed.      Two sessile polyps were found in the descending colon. The  polyps were 1       to 2 mm in size. These polyps were removed with a jumbo cold forceps.       Resection and retrieval were complete. Estimated blood loss was minimal.      Multiple small-mouthed diverticula were found in the sigmoid colon.       Estimated blood loss: none.      No findings of abnormal mucosa in rectum as seen on CT scan. Estimated       blood loss: none.      The exam was otherwise without abnormality on direct and retroflexion       views. Impression:            - Two 1 to 2 mm polyps in the descending colon,                         removed with a jumbo cold forceps. Resected and                         retrieved.                        - Diverticulosis in the sigmoid colon.                        - The examination was otherwise normal on direct and                         retroflexion views. Recommendation:        - Patient has a contact number available for                         emergencies. The signs and symptoms of potential                         delayed complications were discussed with the  patient.                         Return to normal activities tomorrow. Written                         discharge instructions were provided to the patient.                        - Discharge patient to home.                        - Resume previous diet.                        - Continue present medications.                        - Await pathology results.                        - Repeat colonoscopy for surveillance based on                         pathology results.                        -  Return to referring physician as previously                         scheduled.                        - The findings and recommendations were discussed with                         the patient. Procedure Code(s):     --- Professional ---                        [phone], Colonoscopy, flexible; with biopsy, single or                         multiple Diagnosis Code(s):     --- Professional ---                        Z86.010, Personal history of colonic polyps                        D12.4, Benign neoplasm of descending colon                        K57.30, Diverticulosis of large intestine without                         perforation or abscess without bleeding CPT copyright 2022 American [employer] Association. All rights reserved. The codes documented in this report are preliminary and upon coder review may  be revised to meet current compliance requirements. Attending Participation:      I personally performed the entire procedure. Elspeth Jungling, DO [name] DO, DO 04/26/2024 10:33:08 AM This report has been signed electronically. Number of Addenda: 0 Note Initiated On: 04/26/2024 9:38 AM Scope Withdrawal Time: 0 hours 12 minutes 31 seconds  Total Procedure Duration: 0 hours 17 minutes 7 seconds  Estimated Blood Loss:  Estimated blood loss was minimal.      [facility]

## 2024-04-26 NOTE — H&P (Signed)
 Pre-Procedure H&P   Patient ID: Vernon Johnson is a 53 y.o. male.  Gastroenterology Provider: [name], DO  Referring Provider: Dr. Zachary PCP: Zachary Idelia LABOR, MD  Date: 04/26/2024  HPI [name] is a 53 y.o. male who presents today for Esophagogastroduodenoscopy and Colonoscopy for Abnormal abdominal CT, personal history of colon polyps .  Patient attempted colonoscopy in 2023 with poor prep.  Left-sided diverticulosis and internal hemorrhoids were noted.  Colonoscopy in 2018 with 2 sessile serrated polyps and 1 adenomatous polyp.  Presented to the ER for pain December 16, 2023 and CT demonstrated rectal wall thickening as well as pancreatic versus duodenal inflammation.   Past Medical History:  Diagnosis Date   Acquired pes planus    Back pain    Carpal tunnel syndrome    Diabetes mellitus without complication (HCC)    Diverticulitis    Fatty liver    Ganglion cyst    High cholesterol    HLD (hyperlipidemia)    Obesity    Plantar fasciitis    Sleep apnea    Tachycardia     Past Surgical History:  Procedure Laterality Date   COLONOSCOPY WITH PROPOFOL  N/A 01/06/2017   Procedure: COLONOSCOPY WITH PROPOFOL ;  Surgeon: Gaylyn Gladis PENNER, MD;  Location: [facility] ENDOSCOPY;  Service: Endoscopy;  Laterality: N/A;   COLONOSCOPY WITH PROPOFOL  N/A 09/07/2021   Procedure: COLONOSCOPY WITH PROPOFOL ;  Surgeon: Maryruth Ole DASEN, MD;  Location: [facility] ENDOSCOPY;  Service: Endoscopy;  Laterality: N/A;  IDDM    Family History No h/o GI disease or malignancy  Review of Systems  Constitutional:  Negative for activity change, appetite change, chills, diaphoresis, fatigue, fever and unexpected weight change.  HENT:  Negative for trouble swallowing and voice change.   Respiratory:  Negative for shortness of breath and wheezing.   Cardiovascular:  Negative for chest pain, palpitations and leg swelling.  Gastrointestinal:  Positive for diarrhea. Negative for  abdominal distention, abdominal pain, anal bleeding, blood in stool, constipation, nausea and vomiting.  Musculoskeletal:  Negative for arthralgias and myalgias.  Skin:  Negative for color change and pallor.  Neurological:  Negative for dizziness, syncope and weakness.  Psychiatric/Behavioral:  Negative for confusion. The patient is not nervous/anxious.   All other systems reviewed and are negative.    Medications Medications Ordered Prior to Encounter[1]  Pertinent medications related to GI and procedure were reviewed by me with the patient prior to the procedure  Current Medications[2]  sodium chloride          Allergies[3] Allergies were reviewed by me prior to the procedure  Objective   Body mass index is 36.45 kg/m. Vitals:   04/26/24 0933  BP: (!) 156/89  Pulse: 93  Resp: 13  Temp: (!) 96.3 F (35.7 C)  TempSrc: Temporal  SpO2: 100%  Weight: 115.2 kg  Height: 5' 10 (1.778 m)     Physical Exam Vitals and nursing note reviewed.  Constitutional:      General: He is not in acute distress.    Appearance: Normal appearance. He is obese. He is not ill-appearing, toxic-appearing or diaphoretic.  HENT:     Head: Normocephalic and atraumatic.     Nose: Nose normal.     Mouth/Throat:     Mouth: Mucous membranes are moist.     Pharynx: Oropharynx is clear.  Eyes:     General: No scleral icterus.    Extraocular Movements: Extraocular movements intact.  Cardiovascular:     Rate and  Rhythm: Normal rate and regular rhythm.     Heart sounds: Normal heart sounds. No murmur heard.    No friction rub. No gallop.  Pulmonary:     Effort: Pulmonary effort is normal. No respiratory distress.     Breath sounds: Normal breath sounds. No wheezing, rhonchi or rales.  Abdominal:     General: Bowel sounds are normal. There is no distension.     Palpations: Abdomen is soft.     Tenderness: There is no abdominal tenderness. There is no guarding or rebound.  Musculoskeletal:      Cervical back: Neck supple.     Right lower leg: No edema.     Left lower leg: No edema.  Skin:    General: Skin is warm and dry.     Coloration: Skin is not jaundiced or pale.  Neurological:     General: No focal deficit present.     Mental Status: He is alert and oriented to person, place, and time. Mental status is at baseline.  Psychiatric:        Mood and Affect: Mood normal.        Behavior: Behavior normal.        Thought Content: Thought content normal.        Judgment: Judgment normal.      Assessment:  [name] is a 53 y.o. male  who presents today for Esophagogastroduodenoscopy and Colonoscopy for Abnormal abdominal CT, personal history of colon polyps .  Plan:  Esophagogastroduodenoscopy and Colonoscopy with possible intervention today  Esophagogastroduodenoscopy and Colonoscopy with possible biopsy, control of bleeding, polypectomy, and interventions as necessary has been discussed with the patient/patient representative. Informed consent was obtained from the patient/patient representative after explaining the indication, nature, and risks of the procedure including but not limited to death, bleeding, perforation, missed neoplasm/lesions, cardiorespiratory compromise, and reaction to medications. Opportunity for questions was given and appropriate answers were provided. Patient/patient representative has verbalized understanding is amenable to undergoing the procedure.   [name], DO  [facility]enter[facility]  Portions of the record may have been created with voice recognition software. Occasional wrong-word or 'sound-a-like' substitutions may have occurred due to the inherent limitations of voice recognition software.  Read the chart carefully and recognize, using context, where substitutions may have occurred.    [1]  No current facility-administered medications on file prior to encounter.   Current Outpatient Medications on File  Prior to Encounter  Medication Sig Dispense Refill   baclofen (LIORESAL) 10 MG tablet Take 10 mg by mouth at bedtime as needed for muscle spasms.     cetirizine (ZYRTEC) 10 MG chewable tablet Chew 10 mg by mouth daily.     gabapentin (NEURONTIN) 300 MG capsule Take 300 mg by mouth 2 (two) times daily.     glipiZIDE (GLUCOTROL) 10 MG tablet Take 20 mg by mouth 2 (two) times daily before a meal.      insulin aspart (NOVOLOG) 100 UNIT/ML injection Inject 36 Units into the skin every morning. 35 units with LUNCH 36 units with DINNER     insulin glargine (LANTUS) 100 UNIT/ML injection Inject 80 Units into the skin at bedtime.     lidocaine  (LIDODERM ) 5 % Place 1 patch onto the skin daily. Remove & Discard patch within 12 hours or as directed by MD     lisinopril (ZESTRIL) 5 MG tablet Take 5 mg by mouth daily.     Melatonin Gummies 2.5 MG CHEW Chew 1 tablet  by mouth at bedtime as needed (sleep).     naproxen  (NAPROSYN ) 500 MG tablet Take 500 mg by mouth 2 (two) times daily with a meal.     pravastatin (PRAVACHOL) 10 MG tablet Take 10 mg by mouth daily.     sitaGLIPtin-metformin (JANUMET) 50-1000 MG tablet Take 1 tablet by mouth 2 (two) times daily with a meal.     aspirin 81 MG chewable tablet Chew 81 mg by mouth daily. (Patient not taking: Reported on 09/07/2021)     glucose blood test strip 1 each by Other route as needed for other. Use as instructed     HYDROcodone -acetaminophen  (NORCO) 5-325 MG tablet Take 1-2 tablets by mouth every 6 (six) hours as needed. (Patient not taking: Reported on 09/07/2021) 20 tablet 0   mupirocin  ointment (BACTROBAN ) 2 % Apply two times a day for 7 days. 22 g 0   pantoprazole  (PROTONIX ) 40 MG tablet Take 1 tablet (40 mg total) by mouth 2 (two) times daily for 14 days. 28 tablet 0  [2]  Current Facility-Administered Medications:    0.9 %  sodium chloride  infusion, , Intravenous, Continuous, Onita Elspeth Sharper, DO [3]  Allergies Allergen Reactions   Atorvastatin  Other (See Comments)    Muscle Pain

## 2024-04-26 NOTE — Interval H&P Note (Signed)
 History and Physical Interval Note: Preprocedure H&P from 04/26/2024  was reviewed and there was no interval change after seeing and examining the patient.  Written consent was obtained from the patient after discussion of risks, benefits, and alternatives. Patient has consented to proceed with Esophagogastroduodenoscopy and Colonoscopy with possible intervention   04/26/2024 9:49 AM  Vernon Johnson  has presented today for surgery, with the diagnosis of Abnormal CT of the abdomen (R93.5) Personal history of adenomatous and serrated colon polyps (Z86.0101).  The various methods of treatment have been discussed with the patient and family. After consideration of risks, benefits and other options for treatment, the patient has consented to  Procedures: COLONOSCOPY (N/A) EGD (ESOPHAGOGASTRODUODENOSCOPY) (N/A) as a surgical intervention.  The patient's history has been reviewed, patient examined, no change in status, stable for surgery.  I have reviewed the patient's chart and labs.  Questions were answered to the patient's satisfaction.     [name]

## 2024-04-26 NOTE — Anesthesia Preprocedure Evaluation (Signed)
 Anesthesia Evaluation  Patient identified by MRN, date of birth, ID band Patient awake    Reviewed: Allergy & Precautions, H&P , NPO status , Patient's Chart, lab work & pertinent test results, reviewed documented beta blocker date and time   Airway Mallampati: II   Neck ROM: full    Dental  (+) Poor Dentition   Pulmonary sleep apnea and Continuous Positive Airway Pressure Ventilation , Patient did not abstain from smoking., former smoker   Pulmonary exam normal        Cardiovascular Exercise Tolerance: Good negative cardio ROS Normal cardiovascular exam Rhythm:regular Rate:Normal     Neuro/Psych  Neuromuscular disease  negative psych ROS   GI/Hepatic negative GI ROS, Neg liver ROS,,,  Endo/Other  diabetes, Well Controlled  Class 3 obesity  Renal/GU negative Renal ROS  negative genitourinary   Musculoskeletal   Abdominal   Peds  Hematology negative hematology ROS (+)   Anesthesia Other Findings Past Medical History: No date: Acquired pes planus No date: Back pain No date: Carpal tunnel syndrome No date: Diabetes mellitus without complication (HCC) No date: Diverticulitis No date: Fatty liver No date: Ganglion cyst No date: High cholesterol No date: HLD (hyperlipidemia) No date: Obesity No date: Plantar fasciitis No date: Sleep apnea No date: Tachycardia Past Surgical History: 01/06/2017: COLONOSCOPY WITH PROPOFOL ; N/A     Comment:  Procedure: COLONOSCOPY WITH PROPOFOL ;  Surgeon:               Gaylyn Gladis PENNER, MD;  Location: [facility] ENDOSCOPY;                Service: Endoscopy;  Laterality: N/A; 09/07/2021: COLONOSCOPY WITH PROPOFOL ; N/A     Comment:  Procedure: COLONOSCOPY WITH PROPOFOL ;  Surgeon:               Maryruth Ole DASEN, MD;  Location: [facility] ENDOSCOPY;                Service: Endoscopy;  Laterality: N/A;  IDDM   Reproductive/Obstetrics negative OB ROS                               Anesthesia Physical Anesthesia Plan  ASA: 3  Anesthesia Plan: General   Post-op Pain Management:    Induction:   PONV Risk Score and Plan:   Airway Management Planned:   Additional Equipment:   Intra-op Plan:   Post-operative Plan:   Informed Consent: I have reviewed the patients History and Physical, chart, labs and discussed the procedure including the risks, benefits and alternatives for the proposed anesthesia with the patient or authorized representative who has indicated his/her understanding and acceptance.     Dental Advisory Given  Plan Discussed with: CRNA  Anesthesia Plan Comments:         Anesthesia Quick Evaluation

## 2024-04-26 NOTE — Op Note (Signed)
 [facility] Patient Name: Vernon Johnson Procedure Date: 04/26/2024 9:38 AM MRN: [id number] Account #: [id number] Date of Birth: 08/09/1970 Admit Type: Outpatient Age: 53 Room: [facility] ENDO ROOM 1 Gender: Male Note Status: Finalized Instrument Name: Endoscope [id number] Procedure:             Upper GI endoscopy Indications:           Abnormal CT scan Providers:             [name] DO, DO Referring MD:          Sionne A. Zachary MD, MD (Referring MD) Medicines:             Monitored Anesthesia Care Complications:         No immediate complications. Estimated blood loss: None. Procedure:             Pre-Anesthesia Assessment:                        - Prior to the procedure, a History and Physical was                         performed, and patient medications and allergies were                         reviewed. The patient is competent. The risks and                         benefits of the procedure and the sedation options and                         risks were discussed with the patient. All questions                         were answered and informed consent was obtained.                         Patient identification and proposed procedure were                         verified by the physician, the nurse, the anesthetist                         and the technician in the endoscopy suite. Mental                         Status Examination: alert and oriented. Airway                         Examination: normal oropharyngeal airway and neck                         mobility. Respiratory Examination: clear to                         auscultation. CV Examination: RRR, no murmurs, no S3                         or S4. Prophylactic Antibiotics: The patient does not  require prophylactic antibiotics. Prior                         Anticoagulants: The patient has taken no anticoagulant                         or antiplatelet  agents. ASA Grade Assessment: III - A                         patient with severe systemic disease. After reviewing                         the risks and benefits, the patient was deemed in                         satisfactory condition to undergo the procedure. The                         anesthesia plan was to use monitored anesthesia care                         (MAC). Immediately prior to administration of                         medications, the patient was re-assessed for adequacy                         to receive sedatives. The heart rate, respiratory                         rate, oxygen saturations, blood pressure, adequacy of                         pulmonary ventilation, and response to care were                         monitored throughout the procedure. The physical                         status of the patient was re-assessed after the                         procedure.                        After obtaining informed consent, the endoscope was                         passed under direct vision. Throughout the procedure,                         the patient's blood pressure, pulse, and oxygen                         saturations were monitored continuously. The Endoscope                         was introduced through the mouth, and advanced to the  third part of duodenum. The upper GI endoscopy was                         accomplished without difficulty. The patient tolerated                         the procedure well. Findings:      The duodenal bulb, first portion of the duodenum, second portion of the       duodenum and third portion of the duodenum were normal. Estimated blood       loss: none.      The entire examined stomach was normal. Estimated blood loss: none.      The Z-line was regular. Estimated blood loss: none.      Esophagogastric landmarks were identified: the gastroesophageal junction       was found at 45 cm from the incisors.      The  exam of the esophagus was otherwise normal. Impression:            - Normal duodenal bulb, first portion of the duodenum,                         second portion of the duodenum and third portion of                         the duodenum.                        - Normal stomach.                        - Z-line regular.                        - Esophagogastric landmarks identified.                        - No specimens collected. Recommendation:        - Patient has a contact number available for                         emergencies. The signs and symptoms of potential                         delayed complications were discussed with the patient.                         Return to normal activities tomorrow. Written                         discharge instructions were provided to the patient.                        - Discharge patient to home.                        - Resume previous diet.                        - Continue present medications.                        -  Return to referring physician as previously                         scheduled.                        - The findings and recommendations were discussed with                         the patient. Procedure Code(s):     --- Professional ---                        [phone], Esophagogastroduodenoscopy, flexible,                         transoral; diagnostic, including collection of                         specimen(s) by brushing or washing, when performed                         (separate procedure) CPT copyright 2022 [employer]. All rights reserved. The codes documented in this report are preliminary and upon coder review may  be revised to meet current compliance requirements. Attending Participation:      I personally performed the entire procedure. Elspeth Jungling, DO [name] DO, DO 04/26/2024 10:07:43 AM This report has been signed electronically. Number of Addenda: 0 Note Initiated On: 04/26/2024  9:38 AM Estimated Blood Loss:  Estimated blood loss: none.      [facility]

## 2024-04-27 LAB — SURGICAL PATHOLOGY
